# Patient Record
Sex: Female | Born: 1940 | Race: White | Hispanic: No | Smoking: Never smoker
Health system: Southern US, Community
[De-identification: ages and names within clinical notes are randomized; demographics above are authoritative.]

## PROBLEM LIST (undated history)

## (undated) DIAGNOSIS — I480 Paroxysmal atrial fibrillation: Secondary | ICD-10-CM

## (undated) DIAGNOSIS — R011 Cardiac murmur, unspecified: Secondary | ICD-10-CM

## (undated) DIAGNOSIS — C801 Malignant (primary) neoplasm, unspecified: Secondary | ICD-10-CM

## (undated) DIAGNOSIS — I8390 Asymptomatic varicose veins of unspecified lower extremity: Secondary | ICD-10-CM

## (undated) DIAGNOSIS — Q600 Renal agenesis, unilateral: Secondary | ICD-10-CM

## (undated) HISTORY — PX: ABDOMINAL HYSTERECTOMY: SHX81

---

## 2004-07-02 ENCOUNTER — Ambulatory Visit: Payer: Self-pay | Admitting: Gastroenterology

## 2005-10-20 ENCOUNTER — Ambulatory Visit: Payer: Self-pay | Admitting: Internal Medicine

## 2007-09-19 ENCOUNTER — Ambulatory Visit: Payer: Self-pay | Admitting: Family Medicine

## 2008-12-24 ENCOUNTER — Ambulatory Visit: Payer: Self-pay | Admitting: Family Medicine

## 2009-10-07 ENCOUNTER — Ambulatory Visit: Payer: Self-pay | Admitting: Ophthalmology

## 2009-10-19 ENCOUNTER — Ambulatory Visit: Payer: Self-pay | Admitting: Ophthalmology

## 2009-11-16 ENCOUNTER — Ambulatory Visit: Payer: Self-pay | Admitting: Ophthalmology

## 2010-01-28 ENCOUNTER — Ambulatory Visit: Payer: Self-pay | Admitting: Family Medicine

## 2011-03-19 ENCOUNTER — Ambulatory Visit: Payer: Self-pay | Admitting: Internal Medicine

## 2011-04-11 ENCOUNTER — Ambulatory Visit: Payer: Self-pay | Admitting: Family Medicine

## 2012-08-27 DIAGNOSIS — C44519 Basal cell carcinoma of skin of other part of trunk: Secondary | ICD-10-CM | POA: Insufficient documentation

## 2012-08-30 DIAGNOSIS — M8580 Other specified disorders of bone density and structure, unspecified site: Secondary | ICD-10-CM | POA: Insufficient documentation

## 2012-08-30 DIAGNOSIS — E785 Hyperlipidemia, unspecified: Secondary | ICD-10-CM | POA: Insufficient documentation

## 2012-09-04 ENCOUNTER — Ambulatory Visit: Payer: Self-pay | Admitting: Family Medicine

## 2012-10-30 DIAGNOSIS — R011 Cardiac murmur, unspecified: Secondary | ICD-10-CM | POA: Insufficient documentation

## 2012-11-22 ENCOUNTER — Ambulatory Visit: Payer: Self-pay | Admitting: Family Medicine

## 2013-10-17 ENCOUNTER — Ambulatory Visit: Payer: Self-pay | Admitting: Family Medicine

## 2014-03-13 DIAGNOSIS — H409 Unspecified glaucoma: Secondary | ICD-10-CM | POA: Insufficient documentation

## 2014-04-02 ENCOUNTER — Ambulatory Visit: Payer: Self-pay | Admitting: Family Medicine

## 2014-06-02 ENCOUNTER — Ambulatory Visit: Payer: Self-pay | Admitting: Unknown Physician Specialty

## 2014-07-14 ENCOUNTER — Other Ambulatory Visit: Payer: Self-pay | Admitting: Oncology

## 2014-12-19 ENCOUNTER — Other Ambulatory Visit: Payer: Self-pay

## 2014-12-19 DIAGNOSIS — Z1231 Encounter for screening mammogram for malignant neoplasm of breast: Secondary | ICD-10-CM

## 2015-01-06 ENCOUNTER — Ambulatory Visit: Payer: Self-pay

## 2015-02-25 ENCOUNTER — Ambulatory Visit
Admission: RE | Admit: 2015-02-25 | Discharge: 2015-02-25 | Disposition: A | Discharge status: Home / Self Care | Payer: Medicare Other | Source: Ambulatory Visit | Attending: Family Medicine | Admitting: Family Medicine

## 2015-02-25 DIAGNOSIS — Z1231 Encounter for screening mammogram for malignant neoplasm of breast: Secondary | ICD-10-CM | POA: Diagnosis present

## 2015-02-25 HISTORY — DX: Malignant (primary) neoplasm, unspecified: C80.1

## 2015-04-30 DIAGNOSIS — I8390 Asymptomatic varicose veins of unspecified lower extremity: Secondary | ICD-10-CM | POA: Insufficient documentation

## 2015-07-03 ENCOUNTER — Other Ambulatory Visit: Payer: Self-pay | Admitting: Family Medicine

## 2015-07-03 DIAGNOSIS — Z1231 Encounter for screening mammogram for malignant neoplasm of breast: Secondary | ICD-10-CM

## 2015-07-03 DIAGNOSIS — M810 Age-related osteoporosis without current pathological fracture: Secondary | ICD-10-CM

## 2015-07-23 ENCOUNTER — Ambulatory Visit
Admission: RE | Admit: 2015-07-23 | Discharge: 2015-07-23 | Disposition: A | Discharge status: Home / Self Care | Payer: Medicare Other | Source: Ambulatory Visit | Attending: Family Medicine | Admitting: Family Medicine

## 2015-07-23 DIAGNOSIS — M810 Age-related osteoporosis without current pathological fracture: Secondary | ICD-10-CM | POA: Insufficient documentation

## 2015-07-23 DIAGNOSIS — Z780 Asymptomatic menopausal state: Secondary | ICD-10-CM | POA: Insufficient documentation

## 2016-05-10 DIAGNOSIS — M810 Age-related osteoporosis without current pathological fracture: Secondary | ICD-10-CM | POA: Insufficient documentation

## 2016-05-23 ENCOUNTER — Other Ambulatory Visit: Payer: Self-pay | Admitting: Family Medicine

## 2016-05-23 DIAGNOSIS — Z1231 Encounter for screening mammogram for malignant neoplasm of breast: Secondary | ICD-10-CM

## 2016-05-24 ENCOUNTER — Ambulatory Visit
Admission: RE | Admit: 2016-05-24 | Discharge: 2016-05-24 | Disposition: A | Discharge status: Home / Self Care | Payer: Medicare Other | Source: Ambulatory Visit | Attending: Family Medicine | Admitting: Family Medicine

## 2016-05-24 DIAGNOSIS — Z1231 Encounter for screening mammogram for malignant neoplasm of breast: Secondary | ICD-10-CM | POA: Insufficient documentation

## 2017-07-18 ENCOUNTER — Other Ambulatory Visit: Payer: Self-pay | Admitting: Family Medicine

## 2017-07-18 DIAGNOSIS — M899 Disorder of bone, unspecified: Secondary | ICD-10-CM

## 2017-07-18 DIAGNOSIS — Z905 Acquired absence of kidney: Secondary | ICD-10-CM | POA: Insufficient documentation

## 2017-07-18 DIAGNOSIS — M949 Disorder of cartilage, unspecified: Principal | ICD-10-CM

## 2017-08-09 ENCOUNTER — Other Ambulatory Visit: Payer: Self-pay | Admitting: Family Medicine

## 2017-08-09 DIAGNOSIS — Z1231 Encounter for screening mammogram for malignant neoplasm of breast: Secondary | ICD-10-CM

## 2017-08-24 ENCOUNTER — Ambulatory Visit
Admission: RE | Admit: 2017-08-24 | Discharge: 2017-08-24 | Disposition: A | Discharge status: Home / Self Care | Payer: Medicare Other | Source: Ambulatory Visit | Attending: Family Medicine | Admitting: Family Medicine

## 2017-08-24 DIAGNOSIS — Z1231 Encounter for screening mammogram for malignant neoplasm of breast: Secondary | ICD-10-CM

## 2017-08-24 DIAGNOSIS — M899 Disorder of bone, unspecified: Secondary | ICD-10-CM

## 2017-08-24 DIAGNOSIS — M810 Age-related osteoporosis without current pathological fracture: Secondary | ICD-10-CM | POA: Insufficient documentation

## 2017-08-24 DIAGNOSIS — M949 Disorder of cartilage, unspecified: Secondary | ICD-10-CM

## 2018-10-05 ENCOUNTER — Other Ambulatory Visit: Payer: Self-pay | Admitting: Family Medicine

## 2018-10-05 DIAGNOSIS — Z1231 Encounter for screening mammogram for malignant neoplasm of breast: Secondary | ICD-10-CM

## 2018-10-05 DIAGNOSIS — Z780 Asymptomatic menopausal state: Secondary | ICD-10-CM

## 2018-11-02 ENCOUNTER — Other Ambulatory Visit: Payer: Self-pay | Admitting: Family Medicine

## 2018-11-02 DIAGNOSIS — Z780 Asymptomatic menopausal state: Secondary | ICD-10-CM

## 2018-11-02 DIAGNOSIS — Z1231 Encounter for screening mammogram for malignant neoplasm of breast: Secondary | ICD-10-CM

## 2019-02-05 ENCOUNTER — Ambulatory Visit
Admission: RE | Admit: 2019-02-05 | Discharge: 2019-02-05 | Disposition: A | Discharge status: Home / Self Care | Payer: Medicare Other | Source: Ambulatory Visit | Attending: Family Medicine | Admitting: Family Medicine

## 2019-02-05 ENCOUNTER — Encounter (INDEPENDENT_AMBULATORY_CARE_PROVIDER_SITE_OTHER): Payer: Self-pay

## 2019-02-05 ENCOUNTER — Other Ambulatory Visit: Payer: Self-pay

## 2019-02-05 DIAGNOSIS — Z780 Asymptomatic menopausal state: Secondary | ICD-10-CM | POA: Diagnosis present

## 2019-02-05 DIAGNOSIS — Z1231 Encounter for screening mammogram for malignant neoplasm of breast: Secondary | ICD-10-CM | POA: Diagnosis present

## 2019-10-18 ENCOUNTER — Other Ambulatory Visit: Payer: Self-pay | Admitting: Family Medicine

## 2019-10-18 DIAGNOSIS — Z1231 Encounter for screening mammogram for malignant neoplasm of breast: Secondary | ICD-10-CM

## 2020-03-17 ENCOUNTER — Emergency Department
Admission: EM | Admit: 2020-03-17 | Discharge: 2020-03-17 | Disposition: A | Discharge status: Home / Self Care | Payer: Medicare Other | Attending: Emergency Medicine | Admitting: Emergency Medicine

## 2020-03-17 ENCOUNTER — Emergency Department: Payer: Medicare Other

## 2020-03-17 ENCOUNTER — Other Ambulatory Visit: Payer: Self-pay

## 2020-03-17 DIAGNOSIS — Z85828 Personal history of other malignant neoplasm of skin: Secondary | ICD-10-CM | POA: Insufficient documentation

## 2020-03-17 DIAGNOSIS — I498 Other specified cardiac arrhythmias: Secondary | ICD-10-CM | POA: Diagnosis not present

## 2020-03-17 DIAGNOSIS — N3000 Acute cystitis without hematuria: Secondary | ICD-10-CM

## 2020-03-17 DIAGNOSIS — R5383 Other fatigue: Secondary | ICD-10-CM | POA: Diagnosis present

## 2020-03-17 DIAGNOSIS — R531 Weakness: Secondary | ICD-10-CM

## 2020-03-17 LAB — BASIC METABOLIC PANEL
Anion gap: 13 (ref 5–15)
BUN: 15 mg/dL (ref 8–23)
CO2: 22 mmol/L (ref 22–32)
Calcium: 8.6 mg/dL — ABNORMAL LOW (ref 8.9–10.3)
Chloride: 104 mmol/L (ref 98–111)
Creatinine, Ser: 1.06 mg/dL — ABNORMAL HIGH (ref 0.44–1.00)
GFR calc Af Amer: 57 mL/min — ABNORMAL LOW (ref >60)
GFR calc non Af Amer: 49 mL/min — ABNORMAL LOW (ref >60)
Glucose, Bld: 109 mg/dL — ABNORMAL HIGH (ref 70–99)
Potassium: 3.3 mmol/L — ABNORMAL LOW (ref 3.5–5.1)
Sodium: 139 mmol/L (ref 135–145)

## 2020-03-17 LAB — URINALYSIS, COMPLETE (UACMP) WITH MICROSCOPIC
Bilirubin Urine: NEGATIVE
Glucose, UA: NEGATIVE mg/dL
Ketones, ur: 5 mg/dL — AB
Nitrite: NEGATIVE
Protein, ur: NEGATIVE mg/dL
Specific Gravity, Urine: 1.003 — ABNORMAL LOW (ref 1.005–1.030)
pH: 6 (ref 5.0–8.0)

## 2020-03-17 LAB — CBC
HCT: 38.9 % (ref 36.0–46.0)
Hemoglobin: 13.6 g/dL (ref 12.0–15.0)
MCH: 32.8 pg (ref 26.0–34.0)
MCHC: 35 g/dL (ref 30.0–36.0)
MCV: 93.7 fL (ref 80.0–100.0)
Platelets: 300 10*3/uL (ref 150–400)
RBC: 4.15 MIL/uL (ref 3.87–5.11)
RDW: 12.6 % (ref 11.5–15.5)
WBC: 8.5 10*3/uL (ref 4.0–10.5)
nRBC: 0 % (ref 0.0–0.2)

## 2020-03-17 LAB — TROPONIN I (HIGH SENSITIVITY): Troponin I (High Sensitivity): 6 ng/L (ref <18)

## 2020-03-17 LAB — TSH: TSH: 1.627 u[IU]/mL (ref 0.350–4.500)

## 2020-03-17 LAB — MAGNESIUM: Magnesium: 2 mg/dL (ref 1.7–2.4)

## 2020-03-17 MED ORDER — POTASSIUM CHLORIDE CRYS ER 20 MEQ PO TBCR
20.0000 meq | EXTENDED_RELEASE_TABLET | Freq: Once | ORAL | Status: AC
  Administered 2020-03-17: 20 meq via ORAL
  Filled 2020-03-17: qty 1

## 2020-03-17 MED ORDER — SODIUM CHLORIDE 0.9% FLUSH: 3.0000 mL | Freq: Once | INTRAVENOUS | Status: DC

## 2020-03-17 MED ORDER — LACTATED RINGERS IV BOLUS
1000.0000 mL | Freq: Once | INTRAVENOUS | Status: AC
  Administered 2020-03-17: 1000 mL via INTRAVENOUS

## 2020-03-17 MED ORDER — CEPHALEXIN 500 MG PO CAPS: 500.0000 mg | ORAL_CAPSULE | Freq: Two times a day (BID) | ORAL | 0 refills | Status: AC

## 2020-03-17 NOTE — ED Provider Notes (Signed)
[facility] Emergency Department Provider Note   ____________________________________________   First MD Initiated Contact with Patient 03/17/20 1214     (approximate)  I have reviewed the triage vital signs and the nursing notes.   HISTORY  Chief Complaint Irregular Heart Beat    HPI Charlene Anderson is a 79 y.o. female with past medical history of skin cancer who presents to the ED complaining of fatigue.  Patient reports that she has been increasingly weak and fatigued over about the past week, scheduled follow-up with her PCP for this today, but while there was told that she has an abnormal heart rhythm.  She was referred to the ED due to concern for atrial fibrillation with RVR.  Patient states that she has been seen by a cardiologist before for irregular heartbeat, but denies any history of atrial fibrillation.  She states she has not had any chest pain, shortness of breath, or palpitations.  She also denies any recent fevers, cough, vomiting, diarrhea, abdominal pain, or dysuria.        Past Medical History:  Diagnosis Date  . Cancer ([place])    skin ca    There are no problems to display for this patient.   Past Surgical History:  Procedure Laterality Date  . ABDOMINAL HYSTERECTOMY      Prior to Admission medications   Medication Sig Start Date End Date Taking? Authorizing Provider  cephALEXin (KEFLEX) 500 MG capsule Take 1 capsule (500 mg total) by mouth 2 (two) times daily for 7 days. 03/17/20 03/24/20  Blake Divine, MD    Allergies Sulfa antibiotics  Family History  Problem Relation Age of Onset  . Breast cancer Mother 3  . Breast cancer Maternal Aunt 44  . Breast cancer Cousin 74       mat cousin    Social History Social History   Tobacco Use  . Smoking status: Not on file  Substance Use Topics  . Alcohol use: Not on file  . Drug use: Not on file    Review of Systems  Constitutional: No fever/chills.  Positive  for generalized weakness and fatigue. Eyes: No visual changes. ENT: No sore throat. Cardiovascular: Denies chest pain. Respiratory: Denies shortness of breath. Gastrointestinal: No abdominal pain.  No nausea, no vomiting.  No diarrhea.  No constipation. Genitourinary: Negative for dysuria. Musculoskeletal: Negative for back pain. Skin: Negative for rash. Neurological: Negative for headaches, focal weakness or numbness.  ____________________________________________   PHYSICAL EXAM:  VITAL SIGNS: ED Triage Vitals  Enc Vitals Group     BP      Pulse      Resp      Temp      Temp src      SpO2      Weight      Height      Head Circumference      Peak Flow      Pain Score      Pain Loc      Pain Edu?      Excl. in [place]?     Constitutional: Alert and oriented. Eyes: Conjunctivae are normal. Head: Atraumatic. Nose: No congestion/rhinnorhea. Mouth/Throat: Mucous membranes are moist. Neck: Normal ROM Cardiovascular: Tachycardic, irregularly irregular rhythm. Grossly normal heart sounds. Respiratory: Normal respiratory effort.  No retractions. Lungs CTAB. Gastrointestinal: Soft and nontender. No distention. Genitourinary: deferred Musculoskeletal: No lower extremity tenderness nor edema. Neurologic:  Normal speech and language. No gross focal neurologic deficits are appreciated. Skin:  Skin is warm, dry and intact. No rash noted. Psychiatric: Mood and affect are normal. Speech and behavior are normal.  ____________________________________________   LABS (all labs ordered are listed, but only abnormal results are displayed)  Labs Reviewed  BASIC METABOLIC PANEL - Abnormal; Notable for the following components:      Result Value   Potassium 3.3 (*)    Glucose, Bld 109 (*)    Creatinine, Ser 1.06 (*)    Calcium 8.6 (*)    GFR calc non Af Amer 49 (*)    GFR calc Af Amer 57 (*)    All other components within normal limits  URINALYSIS, COMPLETE (UACMP) WITH MICROSCOPIC  - Abnormal; Notable for the following components:   Color, Urine YELLOW (*)    APPearance HAZY (*)    Specific Gravity, Urine 1.003 (*)    Hgb urine dipstick SMALL (*)    Ketones, ur 5 (*)    Leukocytes,Ua SMALL (*)    Bacteria, UA FEW (*)    All other components within normal limits  URINE CULTURE  CBC  MAGNESIUM  TSH  TROPONIN I (HIGH SENSITIVITY)   ____________________________________________  EKG  ED ECG REPORT I, Blake Divine, the attending physician, personally viewed and interpreted this ECG.   Date: 03/17/2020  EKG Time: 12:20  Rate: 124  Rhythm: atrial fibrillation, rate 124  Axis: Normal  Intervals:none  ST&T Change: None  ED ECG REPORT I, Blake Divine, the attending physician, personally viewed and interpreted this ECG.   Date: 03/17/2020  EKG Time: 12:55  Rate: 87  Rhythm: normal sinus rhythm, PAC's noted  Axis: Normal  Intervals:none  ST&T Change: None    PROCEDURES  Procedure(s) performed (including Critical Care):  Procedures   ____________________________________________   INITIAL IMPRESSION / ASSESSMENT AND PLAN / ED COURSE       79 year old female with past medical history of skin cancer presents to the ED complaining of generalized weakness for the past week, referred to the ED due to concern for new onset arrhythmia.  Initial EKG showed irregular tachycardia and there was concern for atrial fibrillation, patient however has clearly delineated P waves on cardiac monitor.  Repeat EKG shows sinus rhythm with apparent PACs.  Case discussed with Dr. Ubaldo Glassing of cardiology, who agrees that EKG does not represent atrial fibrillation and is more likely PACs which patient has had previously.  Lab work is remarkable for mild AKI and patient was hydrated with IV fluids.  She also has apparent UTI that could be contributing to her fatigue but work-up is not consistent with sepsis.  We will start patient on Keflex for UTI, she is requesting to be  discharged home.  She was counseled to follow-up with her PCP as well as cardiology and to return to the ED for new or worsening symptoms.  Patient agrees with plan.      ____________________________________________   FINAL CLINICAL IMPRESSION(S) / ED DIAGNOSES  Final diagnoses:  Generalized weakness  Acute cystitis without hematuria  Sinus arrhythmia     ED Discharge Orders         Ordered    cephALEXin (KEFLEX) 500 MG capsule  2 times daily     Discontinue  Reprint     03/17/20 1510           Note:  This document was prepared using Dragon voice recognition software and may include unintentional dictation errors.   Blake Divine, MD 03/17/20 [phone]

## 2020-03-17 NOTE — Consult Note (Signed)
Cardiology Consultation Note    Patient ID: Charlene Anderson, MRN: [id number], DOB/AGE: 1941-07-13 79 y.o. Admit date: 03/17/2020   Date of Consult: 03/17/2020 Primary Physician: Sharyne Peach, MD Primary Cardiologist: Dr. Ubaldo Glassing  Chief Complaint: weakness Reason for Consultation: sinus affhythmia Requesting MD: Dr. Charna Archer  HPI: Charlene Anderson is a 79 y.o. female with history of palpitations in the past.  Over the past several days to week she has felt weak and fatigued.  She had no fever.  She presented to her primary care physician's office today with these complaints.  They did a Covid 19 test which per her report was negative.  She was sent to the emergency room.  Electrocardiogram in the emergency room revealed sinus arrhythmia with frequent PACs.  There was no atrial fibrillation.  Laboratory showed mild hypokalemia with potassium of 3.3.  She had mild volume depletion.  There were no ischemic changes.  Laboratories also revealed a normal troponin.  Magnesium was 2.0.  TSH is 1.6.  Chest x-ray revealed no pulmonary edema or other abnormality.  She is currently pain-free and feels somewhat better after IV fluids.  She is anxious to go home.  Past Medical History:  Diagnosis Date  . Cancer ([place])    skin ca      Surgical History:  Past Surgical History:  Procedure Laterality Date  . ABDOMINAL HYSTERECTOMY       Home Meds: Prior to Admission medications   Not on File    Inpatient Medications:  . potassium chloride  20 mEq Oral Once  . sodium chloride flush  3 mL Intravenous Once     Allergies:  Allergies  Allergen Reactions  . Sulfa Antibiotics Diarrhea    Social History   Socioeconomic History  . Marital status: Widowed    Spouse name: Not on file  . Number of children: Not on file  . Years of education: Not on file  . Highest education level: Not on file  Occupational History  . Not on file  Tobacco Use  . Smoking status: Not on file  Substance and  Sexual Activity  . Alcohol use: Not on file  . Drug use: Not on file  . Sexual activity: Not on file  Other Topics Concern  . Not on file  Social History Narrative  . Not on file   Social Determinants of Health   Financial Resource Strain:   . Difficulty of Paying Living Expenses:   Food Insecurity:   . Worried About Charity fundraiser in the Last Year:   . Arboriculturist in the Last Year:   Transportation Needs:   . Film/video editor (Medical):   Marland Kitchen Lack of Transportation (Non-Medical):   Physical Activity:   . Days of Exercise per Week:   . Minutes of Exercise per Session:   Stress:   . Feeling of Stress :   Social Connections:   . Frequency of Communication with Friends and Family:   . Frequency of Social Gatherings with Friends and Family:   . Attends Religious Services:   . Active Member of Clubs or Organizations:   . Attends Archivist Meetings:   Marland Kitchen Marital Status:   Intimate Partner Violence:   . Fear of Current or Ex-Partner:   . Emotionally Abused:   Marland Kitchen Physically Abused:   . Sexually Abused:      Family History  Problem Relation Age of Onset  . Breast cancer Mother 34  .  Breast cancer Maternal Aunt 37  . Breast cancer Cousin 54       mat cousin     Review of Systems: A 12-system review of systems was performed and is negative except as noted in the HPI.  Labs: No results for input(s): CKTOTAL, CKMB, TROPONINI in the last 72 hours. Lab Results  Component Value Date   WBC 8.5 03/17/2020   HGB 13.6 03/17/2020   HCT 38.9 03/17/2020   MCV 93.7 03/17/2020   PLT 300 03/17/2020    Recent Labs  Lab 03/17/20 1231  NA 139  K 3.3*  CL 104  CO2 22  BUN 15  CREATININE 1.06*  CALCIUM 8.6*  GLUCOSE 109*   No results found for: CHOL, HDL, LDLCALC, TRIG No results found for: DDIMER  Radiology/Studies:  DG Chest 2 View  Result Date: 03/17/2020 CLINICAL DATA:  Atrial fibrillation. EXAM: CHEST - 2 VIEW COMPARISON:  No prior. FINDINGS:  Mediastinum hilar structures normal. Bilateral pleural-parenchymal thickening noted most consistent with scarring. No focal alveolar infiltrate. No pleural effusion or pneumothorax. Midthoracic mild vertebral body mild compression fracture. Age undetermined IMPRESSION: Number bilateral pleural-parenchymal thickening consistent with scarring. 2. Midthoracic mild vertebral body compression fracture, age undetermined. Electronically Signed   By: Marcello Moores  Register   On: 03/17/2020 12:51    Wt Readings from Last 3 Encounters:  03/17/20 54.4 kg    EKG: Sinus arrhythmia with PACs.  No ischemia.  Physical Exam:  Blood pressure 126/74, pulse (!) 115, temperature 97.7 F (36.5 C), temperature source Oral, resp. rate (!) 25, height 5\' 3"  (1.6 m), weight 54.4 kg, SpO2 100 %. Body mass index is 21.26 kg/m. General: Well developed, well nourished, in no acute distress. Head: Normocephalic, atraumatic, sclera non-icteric, no xanthomas, nares are without discharge.  Neck: Negative for carotid bruits. JVD not elevated. Lungs: Clear bilaterally to auscultation without wheezes, rales, or rhonchi. Breathing is unlabored. Heart: RRR with S1 S2. No murmurs, rubs, or gallops appreciated. Abdomen: Soft, non-tender, non-distended with normoactive bowel sounds. No hepatomegaly. No rebound/guarding. No obvious abdominal masses. Msk:  Strength and tone appear normal for age. Extremities: No clubbing or cyanosis. No edema.  Distal pedal pulses are 2+ and equal bilaterally. Neuro: Alert and oriented X 3. No facial asymmetry. No focal deficit. Moves all extremities spontaneously. Psych:  Responds to questions appropriately with a normal affect.     Assessment and Plan  79 year old female who was sent to the emergency room with noted irregular heartbeat.  Work-up in the ER was unremarkable.  Echocardiogram revealed sinus arrhythmia.  There were no ischemic changes.  Troponin was normal.  Potassium was 3.3.  Magnesium is  2.  Chest x-ray was unremarkable.  No obvious cardiac pathology.  No evidence of pulmonary edema.  No evidence of ischemia clinically or by electrocardiogram.  No atrial fibrillation.  She is hemodynamically stable and feels somewhat better after IV fluids.  She is concerned she has an infection somewhere.  She is somewhat tachycardic but is also somewhat anxious.  I think at this point it is okay for her to go home.  We will see her back in the office in 2 to 3 days.  Contact me should she have any further problems.  No further inpatient cardiac work-up appears indicated at present.  Signed, Teodoro Spray MD 03/17/2020, 1:38 PM Pager: [phone]

## 2020-03-17 NOTE — ED Triage Notes (Signed)
Pt here with weakness that started on Wed and went to see her primary and noticed that her heart rate was irregular. Pt has no c/o of pain, vitals WNL.

## 2020-03-19 LAB — URINE CULTURE: Culture: >=100000 — AB

## 2020-07-23 IMAGING — MG DIGITAL SCREENING BILATERAL MAMMOGRAM WITH TOMO AND CAD
8 series · 8 of 24 positions shown · non-contrast
Comparison: Previous exam(s).

CLINICAL DATA: Screening.

EXAM:
DIGITAL SCREENING BILATERAL MAMMOGRAM WITH TOMO AND CAD

[R CC synth-2D]
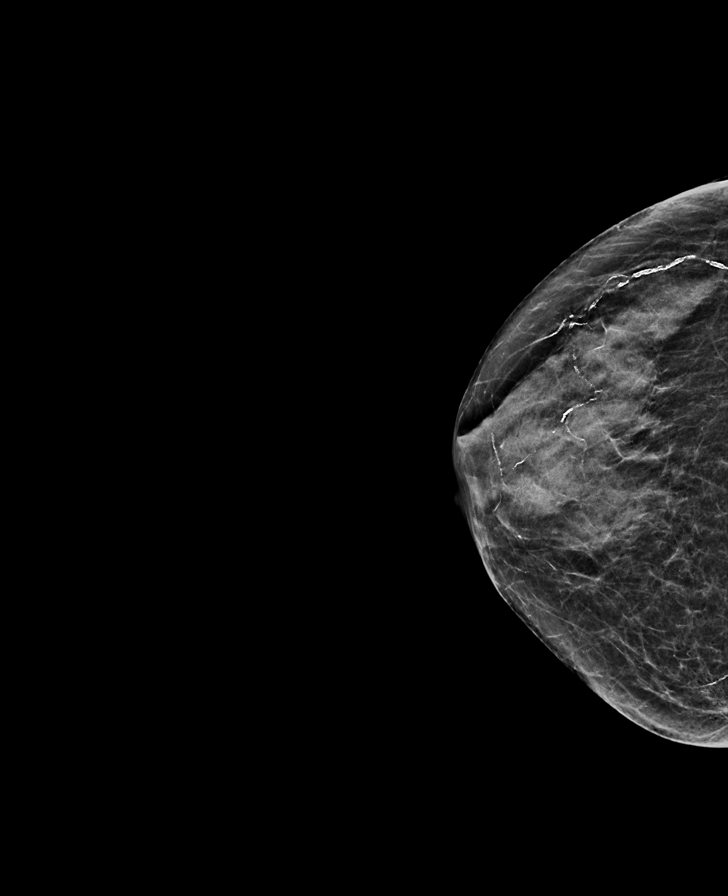

[L CC synth-2D]
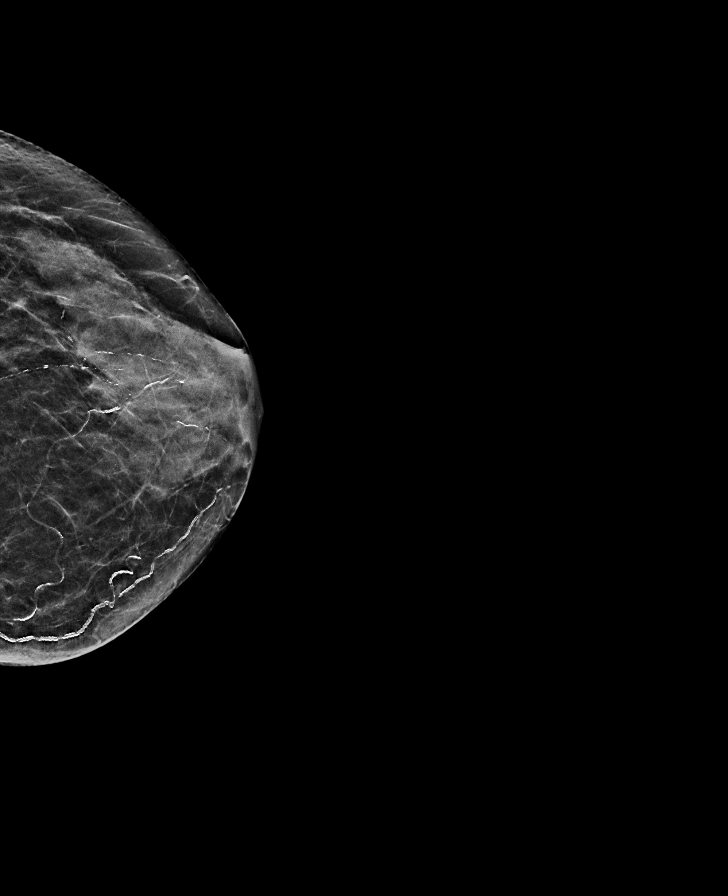

[R MLO synth-2D]
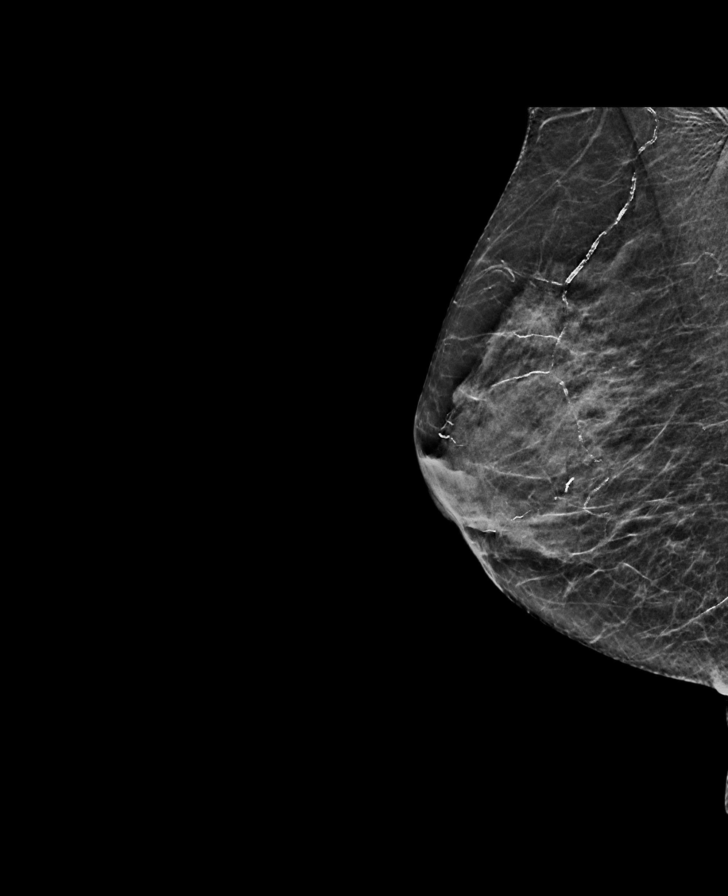

[L MLO synth-2D]
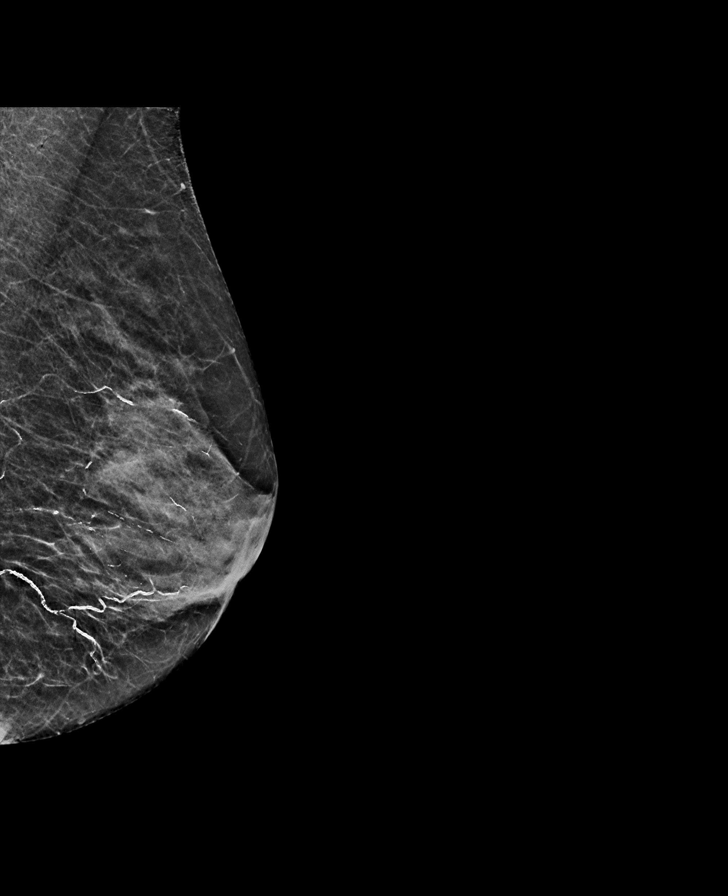

[L CC tomo · tomo slice 23/46.0]
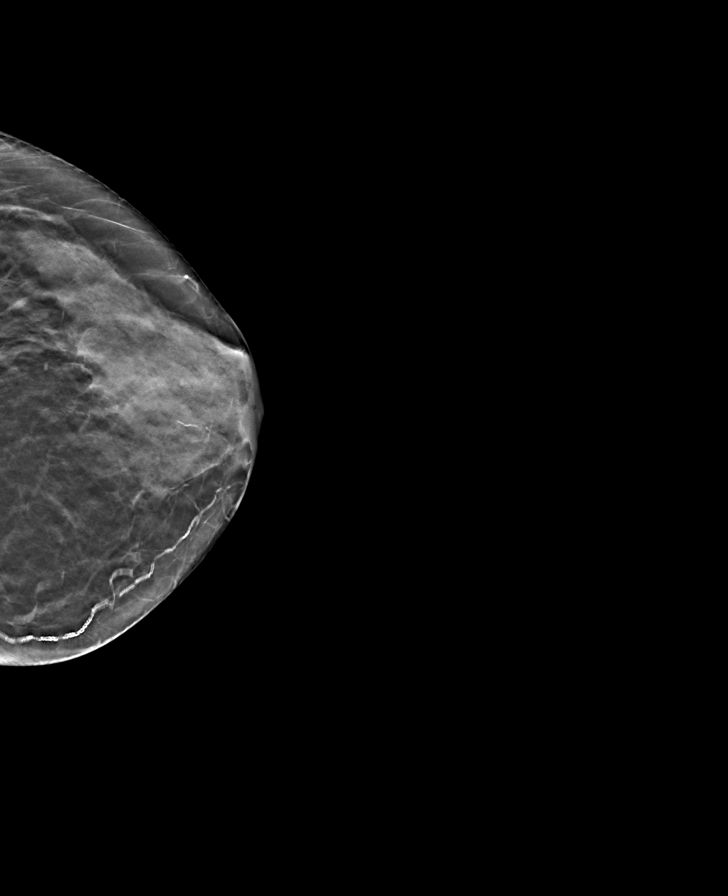

[R CC tomo · tomo slice 24/47.0]
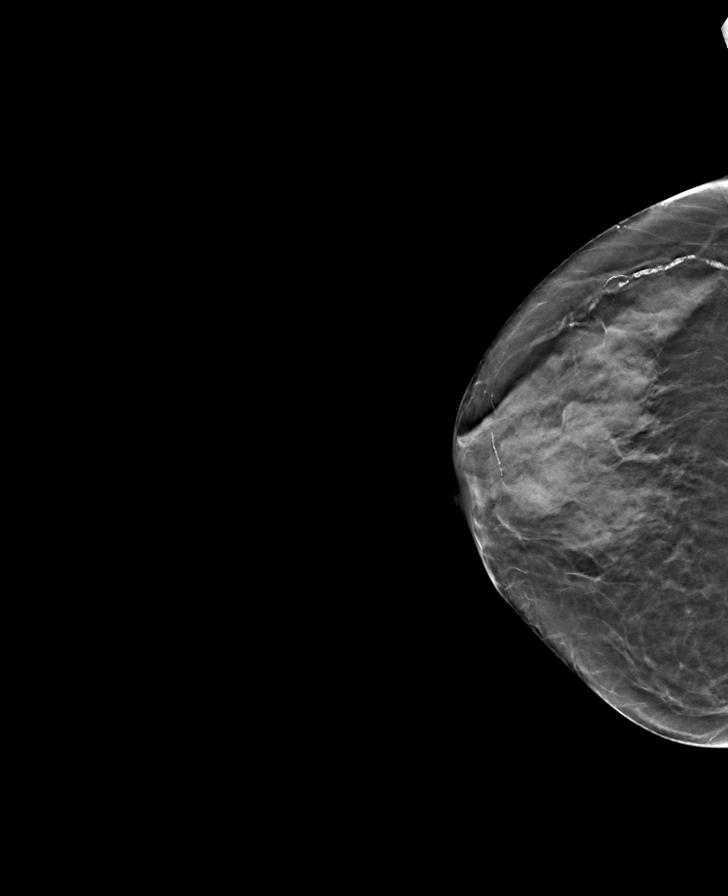

[R MLO tomo · tomo slice 23/44.0]
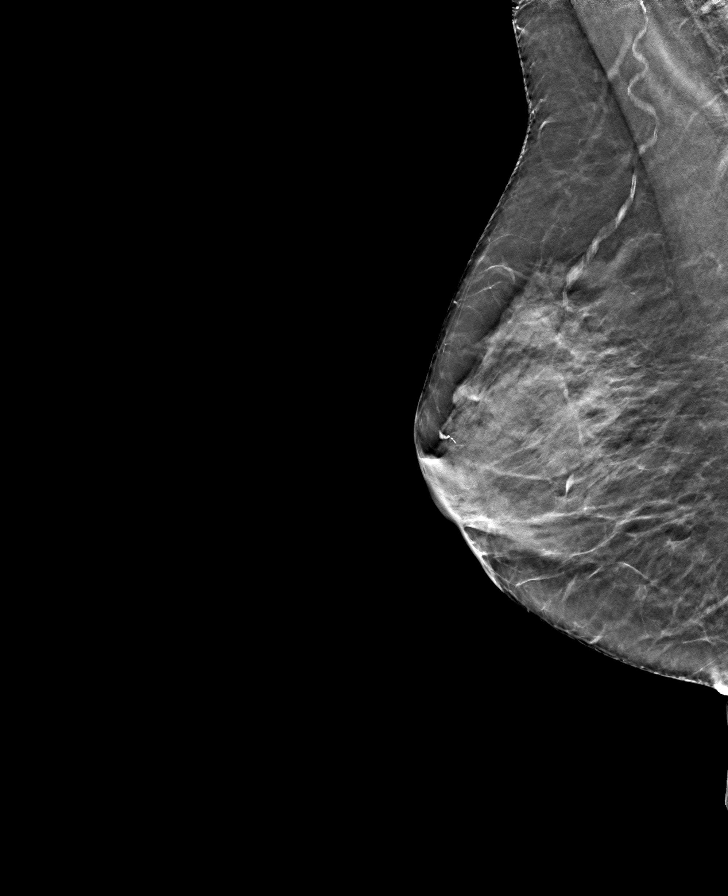

[L MLO tomo · tomo slice 22/43.0]
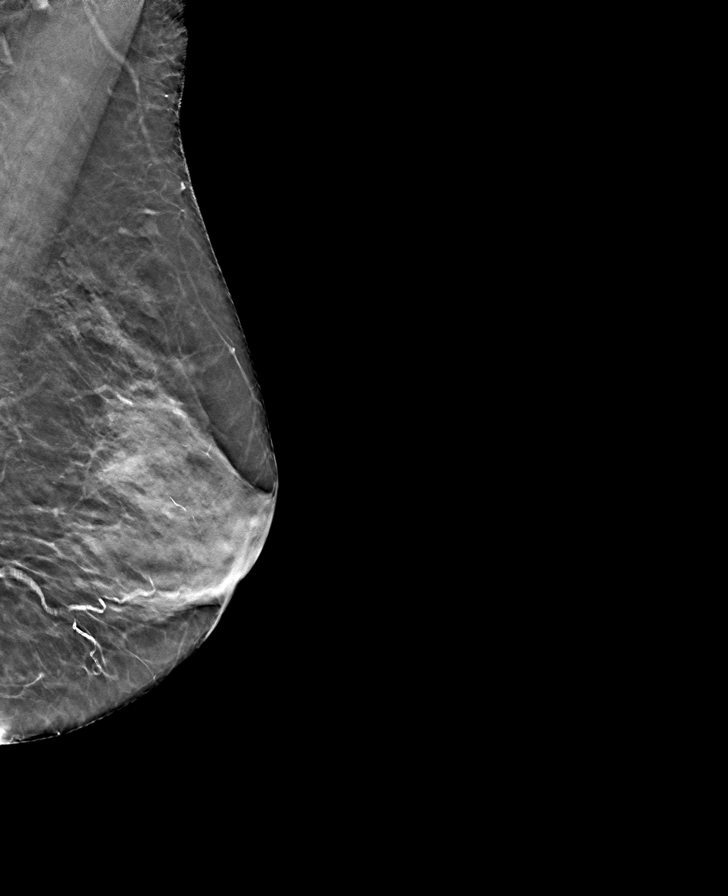

[8 of 24 positions shown; findings below may reference images not displayed]

ACR Breast Density Category d: The breast tissue is extremely dense,
which lowers the sensitivity of mammography
FINDINGS: There are no findings suspicious for malignancy. Images were
processed with CAD.
IMPRESSION: No mammographic evidence of malignancy. A result letter of this
screening mammogram will be mailed directly to the patient.

RECOMMENDATION:
Screening mammogram in one year. (Code:WO-0-ZI0)

BI-RADS CATEGORY  1: Negative.

## 2020-10-08 DIAGNOSIS — I480 Paroxysmal atrial fibrillation: Secondary | ICD-10-CM | POA: Insufficient documentation

## 2020-10-08 DIAGNOSIS — L409 Psoriasis, unspecified: Secondary | ICD-10-CM | POA: Insufficient documentation

## 2020-11-24 ENCOUNTER — Other Ambulatory Visit: Payer: Self-pay | Admitting: Family Medicine

## 2020-11-24 DIAGNOSIS — Z1231 Encounter for screening mammogram for malignant neoplasm of breast: Secondary | ICD-10-CM

## 2020-12-01 ENCOUNTER — Ambulatory Visit
Admission: RE | Admit: 2020-12-01 | Discharge: 2020-12-01 | Disposition: A | Discharge status: Home / Self Care | Payer: Medicare Other | Source: Ambulatory Visit | Attending: Family Medicine | Admitting: Family Medicine

## 2020-12-01 ENCOUNTER — Other Ambulatory Visit: Payer: Self-pay

## 2020-12-01 DIAGNOSIS — Z1231 Encounter for screening mammogram for malignant neoplasm of breast: Secondary | ICD-10-CM | POA: Insufficient documentation

## 2020-12-08 ENCOUNTER — Other Ambulatory Visit: Payer: Self-pay | Admitting: Family Medicine

## 2020-12-08 DIAGNOSIS — Z780 Asymptomatic menopausal state: Secondary | ICD-10-CM

## 2022-04-27 ENCOUNTER — Other Ambulatory Visit: Payer: Self-pay | Admitting: Family Medicine

## 2022-04-27 DIAGNOSIS — Z1231 Encounter for screening mammogram for malignant neoplasm of breast: Secondary | ICD-10-CM

## 2022-05-17 ENCOUNTER — Ambulatory Visit
Admission: RE | Admit: 2022-05-17 | Discharge: 2022-05-17 | Disposition: A | Discharge status: Home / Self Care | Payer: Medicare Other | Source: Ambulatory Visit | Attending: Family Medicine | Admitting: Family Medicine

## 2022-05-17 DIAGNOSIS — Z1231 Encounter for screening mammogram for malignant neoplasm of breast: Secondary | ICD-10-CM | POA: Diagnosis not present

## 2022-11-04 ENCOUNTER — Other Ambulatory Visit: Payer: Self-pay | Admitting: Family Medicine

## 2022-11-04 DIAGNOSIS — Z780 Asymptomatic menopausal state: Secondary | ICD-10-CM

## 2022-12-01 ENCOUNTER — Ambulatory Visit
Admission: RE | Admit: 2022-12-01 | Discharge: 2022-12-01 | Disposition: A | Discharge status: Home / Self Care | Payer: Medicare Other | Source: Ambulatory Visit | Attending: Family Medicine | Admitting: Family Medicine

## 2022-12-01 DIAGNOSIS — Z780 Asymptomatic menopausal state: Secondary | ICD-10-CM | POA: Insufficient documentation

## 2023-01-14 ENCOUNTER — Ambulatory Visit (INDEPENDENT_AMBULATORY_CARE_PROVIDER_SITE_OTHER)
Admission: EM | Admit: 2023-01-14 | Discharge: 2023-01-14 | Disposition: A | Discharge status: Home / Self Care | Payer: Medicare Other | Source: Home / Self Care | Attending: Family Medicine | Admitting: Family Medicine

## 2023-01-14 DIAGNOSIS — N3001 Acute cystitis with hematuria: Secondary | ICD-10-CM | POA: Insufficient documentation

## 2023-01-14 DIAGNOSIS — A419 Sepsis, unspecified organism: Secondary | ICD-10-CM | POA: Diagnosis not present

## 2023-01-14 DIAGNOSIS — I4891 Unspecified atrial fibrillation: Secondary | ICD-10-CM | POA: Diagnosis not present

## 2023-01-14 LAB — URINALYSIS, W/ REFLEX TO CULTURE (INFECTION SUSPECTED)
Bilirubin Urine: NEGATIVE
Glucose, UA: NEGATIVE mg/dL
Ketones, ur: 15 mg/dL — AB
Nitrite: POSITIVE — AB
Protein, ur: 100 mg/dL — AB
Specific Gravity, Urine: 1.02 (ref 1.005–1.030)
pH: 6 (ref 5.0–8.0)

## 2023-01-14 LAB — URINE CULTURE

## 2023-01-14 MED ORDER — CEFDINIR 300 MG PO CAPS: 300.0000 mg | ORAL_CAPSULE | Freq: Two times a day (BID) | ORAL | 0 refills | Status: DC

## 2023-01-14 NOTE — ED Provider Notes (Signed)
[facility]    CSN: [id number] Arrival date & time: 01/14/23  0830      History   Chief Complaint No chief complaint on file.    HPI HPI Charlene Anderson is a 82 y.o. female.    Charlene Anderson presents for difficulty urinating. Believes she has another kidney infection. She only has 1 kidney.  Tried nothing prior to arrival.  Has  not had any antibiotics in last 30 days.   When she urinates she has an odor. Denies back pain, hematuria, fever, chills or abdominal pain.        Past Medical History:  Diagnosis Date   Cancer ([facility])    skin ca    There are no problems to display for this patient.   Past Surgical History:  Procedure Laterality Date   ABDOMINAL HYSTERECTOMY      OB History   No obstetric history on file.      Home Medications    Prior to Admission medications   Medication Sig Start Date End Date Taking? Authorizing Provider  cefdinir (OMNICEF) 300 MG capsule Take 1 capsule (300 mg total) by mouth 2 (two) times daily. 01/14/23  Yes Katha Cabal, DO    Family History Family History  Problem Relation Age of Onset   Breast cancer Mother 49   Breast cancer Maternal Aunt 46   Breast cancer Cousin 50       mat cousin    Social History     Allergies   Sulfa antibiotics   Review of Systems Review of Systems: :negative unless otherwise stated in HPI.      Physical Exam Triage Vital Signs ED Triage Vitals  Enc Vitals Group     BP 01/14/23 0843 135/81     Pulse Rate 01/14/23 0843 (!) 128     Resp 01/14/23 0843 16     Temp 01/14/23 0843 98.2 F (36.8 C)     Temp Source 01/14/23 0843 Oral     SpO2 01/14/23 0843 98 %     Weight 01/14/23 0842 115 lb (52.2 kg)     Height 01/14/23 0842 5\' 2"  (1.575 m)     Head Circumference --      Peak Flow --      Pain Score 01/14/23 0842 3     Pain Loc --      Pain Edu? --      Excl. in [place]? --    No data found.  Updated Vital Signs BP 135/81 (BP Location: Left Arm)    Pulse (!) 128   Temp 98.2 F (36.8 C) (Oral)   Resp 16   Ht 5\' 2"  (1.575 m)   Wt 52.2 kg   SpO2 98%   BMI 21.03 kg/m   Visual Acuity Right Eye Distance:   Left Eye Distance:   Bilateral Distance:    Right Eye Near:   Left Eye Near:    Bilateral Near:     Physical Exam GEN: well appearing female in no acute distress  CVS: well perfused  RESP: speaking in full sentences without pause  ABD: soft, non-tender, non-distended, no palpable masses   SKIN: warm, dry    UC Treatments / Results  Labs (all labs ordered are listed, but only abnormal results are displayed) Labs Reviewed  URINALYSIS, W/ REFLEX TO CULTURE (INFECTION SUSPECTED) - Abnormal; Notable for the following components:      Result Value   Hgb urine dipstick LARGE (*)  Ketones, ur 15 (*)    Protein, ur 100 (*)    Nitrite POSITIVE (*)    Leukocytes,Ua SMALL (*)    Bacteria, UA MANY (*)    All other components within normal limits  URINE CULTURE    EKG   Radiology No results found.  Procedures Procedures (including critical care time)  Medications Ordered in UC Medications - No data to display  Initial Impression / Assessment and Plan / UC Course  I have reviewed the triage vital signs and the nursing notes.  Pertinent labs & imaging results that were available during my care of the patient were reviewed by me and considered in my medical decision making (see chart for details).      Patient is a 82 y.o.Marland Kitchen female  who presents for difficulty urinating.  Overall patient is well-appearing and afebrile.  Vital signs stable.  UA consistent with acute cystitis.   Hematuria supported on microscopy.  Treat with Cefdinir 2 times daily for 5 days. Urine culture obtained.  Follow-up sensitivities and change antibiotics, if needed  Return precautions including abdominal pain, fever, chills, nausea, or vomiting given. Discussed MDM, treatment plan and plan for follow-up with patient who agrees with plan.      Final Clinical Impressions(s) / UC Diagnoses   Final diagnoses:  Acute cystitis with hematuria     Discharge Instructions      You have an urinary infection infection.  Stop by the pharmacy to pick up your prescriptions.  Follow up with your primary care provider as needed.      ED Prescriptions     Medication Sig Dispense Auth. Provider   cefdinir (OMNICEF) 300 MG capsule Take 1 capsule (300 mg total) by mouth 2 (two) times daily. 10 capsule Katha Cabal, DO      PDMP not reviewed this encounter.   Katha Cabal, DO 01/14/23 [phone]

## 2023-01-14 NOTE — Discharge Instructions (Signed)
You have an urinary infection infection.  Stop by the pharmacy to pick up your prescriptions.  Follow up with your primary care provider as needed.

## 2023-01-14 NOTE — ED Triage Notes (Addendum)
Patient presents with what she described as a kidney infection, shared "I  can barely urinate, lethargic, decreased appetite, have right lower pain where kidney is located." Pt states sx started Thursday morning.

## 2023-01-15 LAB — URINE CULTURE

## 2023-01-16 LAB — URINE CULTURE

## 2023-01-17 ENCOUNTER — Other Ambulatory Visit: Payer: Self-pay

## 2023-01-17 ENCOUNTER — Emergency Department: Payer: Medicare Other

## 2023-01-17 ENCOUNTER — Encounter: Payer: Self-pay | Admitting: Family Medicine

## 2023-01-17 ENCOUNTER — Encounter: Payer: Self-pay | Admitting: *Deleted

## 2023-01-17 ENCOUNTER — Ambulatory Visit (INDEPENDENT_AMBULATORY_CARE_PROVIDER_SITE_OTHER): Payer: Medicare Other

## 2023-01-17 ENCOUNTER — Ambulatory Visit
Admission: EM | Admit: 2023-01-17 | Discharge: 2023-01-17 | Disposition: A | Discharge status: Home / Self Care | Payer: Medicare Other | Source: Home / Self Care

## 2023-01-17 ENCOUNTER — Inpatient Hospital Stay
Admission: EM | Admit: 2023-01-17 | Discharge: 2023-01-21 | DRG: 871 | Disposition: A | Discharge status: Home / Self Care | Payer: Medicare Other | Attending: Internal Medicine | Admitting: Internal Medicine

## 2023-01-17 DIAGNOSIS — Q600 Renal agenesis, unilateral: Secondary | ICD-10-CM

## 2023-01-17 DIAGNOSIS — E785 Hyperlipidemia, unspecified: Secondary | ICD-10-CM | POA: Diagnosis present

## 2023-01-17 DIAGNOSIS — Z1152 Encounter for screening for COVID-19: Secondary | ICD-10-CM

## 2023-01-17 DIAGNOSIS — Z803 Family history of malignant neoplasm of breast: Secondary | ICD-10-CM

## 2023-01-17 DIAGNOSIS — Z882 Allergy status to sulfonamides status: Secondary | ICD-10-CM

## 2023-01-17 DIAGNOSIS — I480 Paroxysmal atrial fibrillation: Secondary | ICD-10-CM | POA: Diagnosis present

## 2023-01-17 DIAGNOSIS — R051 Acute cough: Secondary | ICD-10-CM

## 2023-01-17 DIAGNOSIS — I8390 Asymptomatic varicose veins of unspecified lower extremity: Secondary | ICD-10-CM | POA: Diagnosis present

## 2023-01-17 DIAGNOSIS — I4891 Unspecified atrial fibrillation: Secondary | ICD-10-CM | POA: Insufficient documentation

## 2023-01-17 DIAGNOSIS — R0601 Orthopnea: Secondary | ICD-10-CM

## 2023-01-17 DIAGNOSIS — Z79899 Other long term (current) drug therapy: Secondary | ICD-10-CM | POA: Diagnosis not present

## 2023-01-17 DIAGNOSIS — B9620 Unspecified Escherichia coli [E. coli] as the cause of diseases classified elsewhere: Secondary | ICD-10-CM | POA: Diagnosis present

## 2023-01-17 DIAGNOSIS — E872 Acidosis, unspecified: Secondary | ICD-10-CM | POA: Diagnosis present

## 2023-01-17 DIAGNOSIS — Z7901 Long term (current) use of anticoagulants: Secondary | ICD-10-CM

## 2023-01-17 DIAGNOSIS — N390 Urinary tract infection, site not specified: Secondary | ICD-10-CM | POA: Diagnosis present

## 2023-01-17 DIAGNOSIS — R768 Other specified abnormal immunological findings in serum: Secondary | ICD-10-CM | POA: Insufficient documentation

## 2023-01-17 DIAGNOSIS — Z85828 Personal history of other malignant neoplasm of skin: Secondary | ICD-10-CM | POA: Diagnosis not present

## 2023-01-17 DIAGNOSIS — Z90710 Acquired absence of both cervix and uterus: Secondary | ICD-10-CM

## 2023-01-17 DIAGNOSIS — E876 Hypokalemia: Secondary | ICD-10-CM | POA: Diagnosis present

## 2023-01-17 DIAGNOSIS — R000 Tachycardia, unspecified: Secondary | ICD-10-CM | POA: Insufficient documentation

## 2023-01-17 DIAGNOSIS — J9600 Acute respiratory failure, unspecified whether with hypoxia or hypercapnia: Secondary | ICD-10-CM | POA: Diagnosis not present

## 2023-01-17 DIAGNOSIS — Z66 Do not resuscitate: Secondary | ICD-10-CM | POA: Diagnosis present

## 2023-01-17 DIAGNOSIS — L409 Psoriasis, unspecified: Secondary | ICD-10-CM | POA: Diagnosis present

## 2023-01-17 DIAGNOSIS — A419 Sepsis, unspecified organism: Principal | ICD-10-CM | POA: Diagnosis present

## 2023-01-17 DIAGNOSIS — J189 Pneumonia, unspecified organism: Secondary | ICD-10-CM

## 2023-01-17 HISTORY — DX: Renal agenesis, unilateral: Q60.0

## 2023-01-17 HISTORY — DX: Cardiac murmur, unspecified: R01.1

## 2023-01-17 HISTORY — DX: Asymptomatic varicose veins of unspecified lower extremity: I83.90

## 2023-01-17 HISTORY — DX: Paroxysmal atrial fibrillation: I48.0

## 2023-01-17 LAB — CBC WITH DIFFERENTIAL/PLATELET
Abs Immature Granulocytes: 0.08 10*3/uL — ABNORMAL HIGH (ref 0.00–0.07)
Basophils Absolute: 0.1 10*3/uL (ref 0.0–0.1)
Basophils Relative: 1 %
Eosinophils Absolute: 0 10*3/uL (ref 0.0–0.5)
Eosinophils Relative: 0 %
HCT: 39.9 % (ref 36.0–46.0)
Hemoglobin: 13 g/dL (ref 12.0–15.0)
Immature Granulocytes: 1 %
Lymphocytes Relative: 8 %
Lymphs Abs: 1 10*3/uL (ref 0.7–4.0)
MCH: 31.4 pg (ref 26.0–34.0)
MCHC: 32.6 g/dL (ref 30.0–36.0)
MCV: 96.4 fL (ref 80.0–100.0)
Monocytes Absolute: 1.7 10*3/uL — ABNORMAL HIGH (ref 0.1–1.0)
Monocytes Relative: 14 %
Neutro Abs: 9.6 10*3/uL — ABNORMAL HIGH (ref 1.7–7.7)
Neutrophils Relative %: 76 %
Platelets: 329 10*3/uL (ref 150–400)
RBC: 4.14 MIL/uL (ref 3.87–5.11)
RDW: 12.8 % (ref 11.5–15.5)
WBC: 12.5 10*3/uL — ABNORMAL HIGH (ref 4.0–10.5)
nRBC: 0 % (ref 0.0–0.2)

## 2023-01-17 LAB — COMPREHENSIVE METABOLIC PANEL
ALT: 18 U/L (ref 0–44)
AST: 37 U/L (ref 15–41)
Albumin: 2.9 g/dL — ABNORMAL LOW (ref 3.5–5.0)
Alkaline Phosphatase: 53 U/L (ref 38–126)
Anion gap: 13 (ref 5–15)
BUN: 22 mg/dL (ref 8–23)
CO2: 21 mmol/L — ABNORMAL LOW (ref 22–32)
Calcium: 8.3 mg/dL — ABNORMAL LOW (ref 8.9–10.3)
Chloride: 101 mmol/L (ref 98–111)
Creatinine, Ser: 1.06 mg/dL — ABNORMAL HIGH (ref 0.44–1.00)
GFR, Estimated: 52 mL/min — ABNORMAL LOW (ref >60)
Glucose, Bld: 146 mg/dL — ABNORMAL HIGH (ref 70–99)
Potassium: 3.4 mmol/L — ABNORMAL LOW (ref 3.5–5.1)
Sodium: 135 mmol/L (ref 135–145)
Total Bilirubin: 1 mg/dL (ref 0.3–1.2)
Total Protein: 6.9 g/dL (ref 6.5–8.1)

## 2023-01-17 LAB — URINALYSIS, COMPLETE (UACMP) WITH MICROSCOPIC
Bacteria, UA: NONE SEEN
Bilirubin Urine: NEGATIVE
Glucose, UA: NEGATIVE mg/dL
Ketones, ur: 5 mg/dL — AB
Leukocytes,Ua: NEGATIVE
Nitrite: NEGATIVE
Protein, ur: 30 mg/dL — AB
Specific Gravity, Urine: 1.017 (ref 1.005–1.030)
pH: 5 (ref 5.0–8.0)

## 2023-01-17 LAB — LACTIC ACID, PLASMA
Lactic Acid, Venous: 2.7 mmol/L (ref 0.5–1.9)
Lactic Acid, Venous: 2.4 mmol/L (ref 0.5–1.9)

## 2023-01-17 LAB — PROTIME-INR
INR: 1.8 — ABNORMAL HIGH (ref 0.8–1.2)
Prothrombin Time: 20.7 seconds — ABNORMAL HIGH (ref 11.4–15.2)

## 2023-01-17 LAB — RESP PANEL BY RT-PCR (RSV, FLU A&B, COVID)  RVPGX2
Influenza A by PCR: NEGATIVE
Influenza B by PCR: NEGATIVE
Resp Syncytial Virus by PCR: NEGATIVE
SARS Coronavirus 2 by RT PCR: NEGATIVE

## 2023-01-17 LAB — APTT: aPTT: 37 seconds — ABNORMAL HIGH (ref 24–36)

## 2023-01-17 LAB — SARS CORONAVIRUS 2 BY RT PCR: SARS Coronavirus 2 by RT PCR: NEGATIVE

## 2023-01-17 LAB — MRSA NEXT GEN BY PCR, NASAL: MRSA by PCR Next Gen: NOT DETECTED

## 2023-01-17 MED ORDER — ACETAMINOPHEN 325 MG PO TABS
650.0000 mg | ORAL_TABLET | Freq: Four times a day (QID) | ORAL | Status: DC | PRN
  Administered 2023-01-18 – 2023-01-20 (×2): 650 mg via ORAL
  Filled 2023-01-17 (×3): qty 2

## 2023-01-17 MED ORDER — ONDANSETRON HCL 4 MG/2ML IJ SOLN: 4.0000 mg | Freq: Four times a day (QID) | INTRAMUSCULAR | Status: DC | PRN

## 2023-01-17 MED ORDER — ACETAMINOPHEN 650 MG RE SUPP: 650.0000 mg | Freq: Four times a day (QID) | RECTAL | Status: DC | PRN

## 2023-01-17 MED ORDER — ACETAMINOPHEN 325 MG PO TABS
650.0000 mg | ORAL_TABLET | Freq: Once | ORAL | Status: AC
  Administered 2023-01-17: 650 mg via ORAL

## 2023-01-17 MED ORDER — SODIUM CHLORIDE 0.9 % IV SOLN: Freq: Once | INTRAVENOUS | Status: AC

## 2023-01-17 MED ORDER — SODIUM CHLORIDE 0.9 % IV SOLN
500.0000 mg | INTRAVENOUS | Status: DC
  Administered 2023-01-17 – 2023-01-19 (×3): 500 mg via INTRAVENOUS
  Filled 2023-01-17 (×5): qty 5

## 2023-01-17 MED ORDER — SODIUM CHLORIDE 0.9 % IV SOLN
2.0000 g | INTRAVENOUS | Status: DC
  Administered 2023-01-17 – 2023-01-19 (×3): 2 g via INTRAVENOUS
  Filled 2023-01-17 (×5): qty 20

## 2023-01-17 MED ORDER — METOPROLOL TARTRATE 5 MG/5ML IV SOLN
5.0000 mg | Freq: Three times a day (TID) | INTRAVENOUS | Status: DC | PRN
  Administered 2023-01-17: 5 mg via INTRAVENOUS
  Filled 2023-01-17: qty 5

## 2023-01-17 MED ORDER — ONDANSETRON HCL 4 MG PO TABS: 4.0000 mg | ORAL_TABLET | Freq: Four times a day (QID) | ORAL | Status: DC | PRN

## 2023-01-17 MED ORDER — APIXABAN 2.5 MG PO TABS
2.5000 mg | ORAL_TABLET | Freq: Two times a day (BID) | ORAL | Status: DC
  Administered 2023-01-17 – 2023-01-21 (×8): 2.5 mg via ORAL
  Filled 2023-01-17 (×8): qty 1

## 2023-01-17 MED ORDER — ACETAMINOPHEN 325 MG PO TABS
ORAL_TABLET | ORAL | Status: AC
  Filled 2023-01-17: qty 2

## 2023-01-17 MED ORDER — POLYETHYLENE GLYCOL 3350 17 G PO PACK: 17.0000 g | PACK | Freq: Every day | ORAL | Status: DC | PRN

## 2023-01-17 MED ORDER — APIXABAN 5 MG PO TABS: 5.0000 mg | ORAL_TABLET | Freq: Two times a day (BID) | ORAL | Status: DC

## 2023-01-17 NOTE — ED Triage Notes (Signed)
Pt arrives from [facility] via [facility] and lives at home with c/o UTI x 3 days. Pt was seen at urgent care on Saturday and was Dx with a UTI and has been taking ABX since then, but started feeling worse last night and developed a cough last night. Pt has a Hx of afib and does not take medication to regulate it.

## 2023-01-17 NOTE — ED Notes (Signed)
Hospitalist was at bedside. Pt needed to urinate but then did not.

## 2023-01-17 NOTE — Discharge Instructions (Signed)

## 2023-01-17 NOTE — ED Notes (Signed)
Dr Cyril Loosen informed lactic 2.7

## 2023-01-17 NOTE — ED Provider Notes (Signed)
[facility]    CSN: [id number] Arrival date & time: 01/17/23  1146      History   Chief Complaint Chief Complaint  Patient presents with   Cough    HPI Charlene Anderson is a 82 y.o. female with history of paroxysmal atrial fibrillation, hyperlipidemia, long term use of anticoagulants, and congenital absence of one kidney.  Of note, she is not on a rate controlled medication for atrial fibrillation. Last follow up with cardio was December 2023.  She presents for fatigue and cough for the past few days.  States cough has gotten worse.  Reports increased cough and shortness of breath when lying flat.  Denies any dyspnea on exertion.  Denies chest pain, fever, nasal congestion, sore throat, abdominal pain, vomiting or diarrhea.  Patient was evaluated on 01/14/2023 by another provider in this department and diagnosed with acute cystitis.  She was placed on cefdinir.  At the time of her previous visit she was well-appearing but tachycardic.  She was afebrile.  She is not currently having any dysuria, frequency or urgency but reports difficulty urinating.  Denies any further back/flank pain that she was experiencing a few days ago when she was diagnosed with UTI.  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      Home Medications    Prior to Admission medications   Medication Sig Start Date End Date Taking? Authorizing Provider  cefdinir (OMNICEF) 300 MG capsule Take 1 capsule (300 mg total) by mouth 2 (two) times daily. 01/14/23  Yes Brimage, Vondra, DO  Cholecalciferol (VITAMIN D-1000 MAX ST) 25 MCG (1000 UT) tablet Take by mouth.   Yes [provider]  ELIQUIS 5 MG TABS tablet SMARTSIG:1 Tablet(s) By Mouth Every 12 Hours   Yes [provider]    Family History Family History  Problem Relation Age of Onset   Breast cancer Mother 53   Breast cancer Maternal Aunt 36   Breast cancer Cousin 50       mat cousin    Social History Social History   Tobacco Use   Smoking status: Never   Smokeless tobacco: Never  Vaping Use   Vaping Use: Never used  Substance Use Topics   Alcohol use: Never   Drug use: Never     Allergies   Sulfa antibiotics   Review of Systems Review of Systems  Constitutional:  Positive for chills and fatigue. Negative for fever.  HENT:  Positive for congestion. Negative for rhinorrhea and sore throat.   Respiratory:  Positive for cough and shortness of breath. Negative for chest tightness and wheezing.   Cardiovascular:  Negative for chest pain, palpitations and leg swelling.  Gastrointestinal:  Positive for nausea. Negative for abdominal pain, diarrhea and vomiting.  Genitourinary:  Positive for decreased urine volume and difficulty urinating. Negative for dysuria, flank pain, hematuria and urgency.  Musculoskeletal:  Negative for back pain.  Neurological:  Negative for dizziness, weakness and headaches.     Physical Exam Triage Vital Signs ED Triage Vitals [01/17/23 1209]  Enc Vitals Group     BP      Pulse      Resp      Temp      Temp src      SpO2      Weight      Height      Head Circumference      Peak Flow       Pain Score 0     Pain Loc      Pain Edu?      Excl. in GC?    No data found.  Updated Vital Signs BP 115/74 (BP Location: Left Arm)   Pulse 80   Temp 98.4 F (36.9 C) (Oral)   Resp 16   SpO2 91% Comment: unsure if accurate     Physical Exam Vitals and nursing note reviewed.  Constitutional:      General: She is not in acute distress.    Appearance: Normal appearance. She is ill-appearing. She is not toxic-appearing.  HENT:     Head: Normocephalic and atraumatic.     Nose: Congestion (slight) present.     Mouth/Throat:     Mouth: Mucous membranes are moist.     Pharynx: Oropharynx is clear.  Eyes:     General: No scleral icterus.       Right eye: No discharge.        Left eye: No discharge.     Conjunctiva/sclera: Conjunctivae normal.  Cardiovascular:     Rate and Rhythm: Tachycardia present. Rhythm irregular.  Pulmonary:     Effort: Pulmonary effort is normal. No respiratory distress.     Breath sounds: Wheezing present.     Comments: Somewhat reduced breath sounds throughout right lung. Abdominal:     Palpations: Abdomen is soft.     Tenderness: There is no abdominal tenderness. There is no right CVA tenderness or left CVA tenderness.  Musculoskeletal:     Cervical back: Neck supple.     Right lower leg: No edema.     Left lower leg: No edema.  Skin:    General: Skin is dry.  Neurological:     General: No focal deficit present.     Mental Status: She is alert. Mental status is at baseline.     Motor: No weakness.     Gait: Gait normal.  Psychiatric:        Mood and Affect: Mood normal.        Behavior: Behavior normal.        Thought Content: Thought content normal.      UC Treatments / Results  Labs (all labs ordered are listed, but only abnormal results are displayed) Labs Reviewed  SARS CORONAVIRUS 2 BY RT PCR    EKG   Radiology DG Chest Port 1 View  Result Date: 01/17/2023 CLINICAL DATA:  Sepsis EXAM: PORTABLE CHEST 1 VIEW COMPARISON:   01/17/2023, 03/17/2020 FINDINGS: Slightly worse diffuse airspace opacity throughout the periphery of the right mid and lower lung compatible with pneumonia. Stable left lung aeration. Similar background hyperinflation. Negative for edema,  effusion or pneumothorax. Normal heart size and vascularity. Trachea midline. Aorta atherosclerotic. No acute osseous finding. Degenerative changes of the shoulders. IMPRESSION: Slightly worse right mid and lower lung peripheral airspace opacity compatible with pneumonia. Electronically Signed   By: Judie Petit.  Shick M.D.   On: 01/17/2023 14:54   DG Chest 2 View  Result Date: 01/17/2023 CLINICAL DATA:  Cough, fatigue, nausea EXAM: CHEST - 2 VIEW COMPARISON:  03/17/2020 FINDINGS: The heart size and mediastinal contours are within normal limits. Heterogeneous airspace opacity throughout the peripheral right lower lobe. Pulmonary hyperinflation and emphysema. Nonacute wedge deformity of the midthoracic spine, approximately T7. IMPRESSION: 1. Heterogeneous airspace opacity throughout the peripheral right lower lobe, concerning for infection. Recommend follow-up PA and lateral radiographs in 6-8 weeks to ensure resolution and exclude underlying malignancy. 2.  Pulmonary hyperinflation and emphysema. Electronically Signed   By: Jearld Lesch M.D.   On: 01/17/2023 13:18    Procedures ED EKG  Date/Time: 01/17/2023 1:13 PM  Performed by: Shirlee Latch, PA-C Authorized by: Shirlee Latch, PA-C   Interpretation:    Interpretation: abnormal     Details:  Atrial fibrillation with RVR Rate:    ECG rate:  135 Rhythm:    Rhythm: atrial fibrillation   Ectopy:    Ectopy: none   QRS:    QRS axis:  Normal   QRS intervals:  Normal   QRS conduction: normal   ST segments:    ST segments:  Normal T waves:    T waves: normal   Comments:     Atrial fibrillation with RVR.  (including critical care time)  Medications Ordered in UC Medications - No data to display  Initial  Impression / Assessment and Plan / UC Course  I have reviewed the triage vital signs and the nursing notes.  Pertinent labs & imaging results that were available during my care of the patient were reviewed by me and considered in my medical decision making (see chart for details).   82 year old female presents for continued illness.  At this time she was reporting cough, fatigue, orthopnea over the past several days which is progressively worsened.  No further UTI symptoms other than difficulty urinating.   Seen here on 01/14/2023 and diagnosed with acute cystitis.  Placed on cefdinir.  Review of urine culture shows it was pansensitive.  Patient's pulse rate ranges from 80 to 200 bpm and average is around 130 bpm.  Oxygen is difficult to obtain and ranges from 76% to 91%.  She does not seem overly short of breath.  Is speaking in full sentences and answering all questions.  Increased respiratory rate after returning from x-ray.  Denies shortness of breath.  Exam significant for slight nasal congestion.  She is ill-appearing but nontoxic.  Few scattered wheezes throughout with overall diminished breath sounds of the right lung.  Abdomen soft and nontender.  No CVA tenderness.  COVID test is negative.  Chest x-ray performed.  X-ray shows airspace opacity throughout the right lower lobe which is concerning for infection.  EKG performed shows atrial fibrillation with RVR.  She is not currently on any rate control medication but is on Eliquis.  Spoke with patient and her son and advised that she needs further immediate treatment in the emergency department.  Concern for possible underlying CHF and exacerbation versus pneumonia and uncontrolled heart rate which is likely contributing to her overall fatigue.  She does also have known urinary tract infection. With possibility of second infection and significant  fatigue/weakness I do question possible sepsis. Advised that she likely needs lab work and  monitoring. Possible admission.  The patient is reluctantly agreeable.  EMS contacted.  Acute illness with potential threat to bodily harm.   Final Clinical Impressions(s) / UC Diagnoses   Final diagnoses:  Atrial fibrillation with RVR (HCC)  Acute cough  Orthopnea  Urinary tract infection without hematuria, site unspecified  Pneumonia of right lung due to infectious organism, unspecified part of lung     Discharge Instructions      You have been advised to follow up immediately in the emergency department for concerning signs.symptoms. If you declined EMS transport, please have a family member take you directly to the ED at this time. Do not delay. Based on concerns about condition, if you do not follow up in th e ED, you may risk poor outcomes including worsening of condition, delayed treatment and potentially life threatening issues. If you have declined to go to the ED at this time, you should call your PCP immediately to set up a follow up appointment.  Go to ED for red flag symptoms, including; fevers you cannot reduce with Tylenol/Motrin, severe headaches, vision changes, numbness/weakness in part of the body, lethargy, confusion, intractable vomiting, severe dehydration, chest pain, breathing difficulty, severe persistent abdominal or pelvic pain, signs of severe infection (increased redness, swelling of an area), feeling faint or passing out, dizziness, etc. You should especially go to the ED for sudden acute worsening of condition if you do not elect to go at this time.      ED Prescriptions   None    PDMP not reviewed this encounter.   Shirlee Latch, PA-C 01/17/23 1622

## 2023-01-17 NOTE — ED Provider Notes (Signed)
[facility] Provider Note    Event Date/Time   First MD Initiated Contact with Patient 01/17/23 1402     (approximate)   History   Cough, shortness of breath   HPI  Charlene Anderson is a 82 y.o. female with a history of paroxysmal atrial fibrillation sent from urgent care for evaluation of cough, shortness of breath.  Patient recently treated with cefdinir for UTI.  She went to urgent care today because of severe cough that started over the last 24 to 48 hours.  She does feel mildly short of breath with exertion.  No calf pain or swelling.  Does not know if she has had fevers send from urgent care because of rapid heart rate, chest x-ray consistent with pneumonia     Physical Exam   Triage Vital Signs: ED Triage Vitals  Enc Vitals Group     BP 01/17/23 1407 134/88     Pulse Rate 01/17/23 1407 (!) 142     Resp 01/17/23 1407 (!) 23     Temp 01/17/23 1407 98.2 F (36.8 C)     Temp Source 01/17/23 1407 Oral     SpO2 01/17/23 1407 100 %     Weight 01/17/23 1409 52.2 kg (115 lb)     Height 01/17/23 1409 1.575 m (5\' 2" )     Head Circumference --      Peak Flow --      Pain Score 01/17/23 1409 0     Pain Loc --      Pain Edu? --      Excl. in GC? --     Most recent vital signs: Vitals:   01/17/23 1407 01/17/23 1435  BP: 134/88 121/80  Pulse: (!) 142 (!) 120  Resp: (!) 23 (!) 25  Temp: 98.2 F (36.8 C)   SpO2: 100% 96%     General: Awake, no distress.  CV:  Good peripheral perfusion.  Irregular rhythm, tachycardia Resp:  Mild tachypnea, bibasilar Rales Abd:  No distention.  Other:    ED Results / Procedures / Treatments   Labs (all labs ordered are listed, but only abnormal results are displayed) Labs Reviewed  LACTIC ACID, PLASMA - Abnormal; Notable for the following components:      Result Value   Lactic Acid, Venous 2.7 (*)    All other components within normal limits  COMPREHENSIVE METABOLIC PANEL - Abnormal; Notable for  the following components:   Potassium 3.4 (*)    CO2 21 (*)    Glucose, Bld 146 (*)    Creatinine, Ser 1.06 (*)    Calcium 8.3 (*)    Albumin 2.9 (*)    GFR, Estimated 52 (*)    All other components within normal limits  CBC WITH DIFFERENTIAL/PLATELET - Abnormal; Notable for the following components:   WBC 12.5 (*)    Neutro Abs 9.6 (*)    Monocytes Absolute 1.7 (*)    Abs Immature Granulocytes 0.08 (*)    All other components within normal limits  PROTIME-INR - Abnormal; Notable for the following components:   Prothrombin Time 20.7 (*)    INR 1.8 (*)    All other components within normal limits  APTT - Abnormal; Notable for the following components:   aPTT 37 (*)    All other components within normal limits  RESP PANEL BY RT-PCR (RSV, FLU A&B, COVID)  [id number]  CULTURE, BLOOD (ROUTINE X 2)  CULTURE, BLOOD (ROUTINE X 2)  URINE CULTURE  LACTIC ACID, PLASMA  URINALYSIS, COMPLETE (UACMP) WITH MICROSCOPIC     EKG  ED ECG REPORT I, Jene Every, the attending physician, personally viewed and interpreted this ECG.  Date: 01/17/2023  Rhythm: Atrial fibrillation with RVR QRS Axis: normal Intervals: Abnormal ST/T Wave abnormalities: normal Narrative Interpretation: no evidence of acute ischemia    RADIOLOGY Chest x-ray performed at urgent care consistent with right lower lobe pneumonia    PROCEDURES:  Critical Care performed:   Procedures   MEDICATIONS ORDERED IN ED: Medications  cefTRIAXone (ROCEPHIN) 2 g in sodium chloride 0.9 % 100 mL IVPB (2 g Intravenous New Bag/Given 01/17/23 1441)  azithromycin (ZITHROMAX) 500 mg in sodium chloride 0.9 % 250 mL IVPB (has no administration in time range)  0.9 %  sodium chloride infusion ( Intravenous New Bag/Given 01/17/23 1440)     IMPRESSION / MDM / ASSESSMENT AND PLAN / ED COURSE  I reviewed the triage vital signs and the nursing notes. Patient's presentation is most consistent with acute presentation with potential  threat to life or bodily function.  Patient presents with cough, tachycardia.  EKG is consistent with atrial fibrillation with RVR, patient has a history of paroxysmal A-fib, this is likely instigated by pneumonia as seen on chest x-ray.  Labs obtained, will treat with IV Rocephin, IV azithromycin, IV fluids  With fluids patient's heart rate has decreased to the 115-120 level, will hold on giving any Cardizem at this time given underlying pneumonia/infection/possible sepsis  Lactic acid is elevated at 2.7, blood pressure is normal, no hypotension  I have consulted the hospitalist for admission        FINAL CLINICAL IMPRESSION(S) / ED DIAGNOSES   Final diagnoses:  Sepsis without acute organ dysfunction, due to unspecified organism [facility])  Pneumonia of right lower lobe due to infectious organism     Rx / DC Orders   ED Discharge Orders     None        Note:  This document was prepared using Dragon voice recognition software and may include unintentional dictation errors.   Jene Every, MD 01/17/23 1455

## 2023-01-17 NOTE — Sepsis Progress Note (Signed)
Sepsis protocol monitored by eLink ?

## 2023-01-17 NOTE — ED Triage Notes (Signed)
Pt states that she was seen the other day and she is feeling better as far as kidney pain but now she has developed a cough which is worse at night when lying flat.

## 2023-01-17 NOTE — H&P (Signed)
History and Physical    Patient: Charlene Anderson OZH:[id number] DOB: 06/13/1941 DOA: 01/17/2023 DOS: the patient was seen and examined on 01/17/2023 PCP: Rayetta Humphrey, MD  Patient coming from: Home  Chief Complaint:  Chief Complaint  Patient presents with   Urinary Tract Infection   Cough   HPI: Charlene Anderson is a 82 y.o. female with medical history significant of paroxysmal atrial fibrillation on Eliquis, solitary kidney, psoriasis, sent ED from [facility] with concern for A-fib with RVR and pneumonia. Pt reports slight cough for the past couple of days.  No fever, rhinorrhea, congestion, nausea, vomiting, chest pain or diarrhea. Feels somewhat short of breath when laying flat. She has been taking antibiotics (cefdinir) prescribed for a UTI on 01/14/23. Denies burning with urination but continues to have pain near her right kidney.   Patient ED initials vitals showed she was afebrile, tachycardic (HR 142) and tachypneic (RR 23) normotensive and satting well on room air.  ED workup included COVID, influenza and RSV negative.  Lactic acid 2.7.  CBC showing leukocytosis with left shift (WBC 12.5).  Blood cultures collected.  Urinalysis, respiratory panel pending.  Urgent care x-ray showing air space opacities throughout the right lower lobe concerning for pneumonia which was thought to be worsened on portal CXR. She was started on ceftriaxone and azithromycin and given a 1L bolus of NS. ED physician consulted hosptialist for evaluation for admission.     Review of Systems: As mentioned in the history of present illness. All other systems reviewed and are negative. Past Medical History:  Diagnosis Date   Cancer (HCC)    skin ca   Cardiac murmur    Congenital absence of one kidney    Paroxysmal atrial fibrillation (HCC)    Varicose veins of lower extremity    Past Surgical History:  Procedure Laterality Date   ABDOMINAL HYSTERECTOMY     Social History:  reports that  she has never smoked. She has never used smokeless tobacco. She reports that she does not drink alcohol and does not use drugs.  Allergies  Allergen Reactions   Sulfa Antibiotics Diarrhea    Family History  Problem Relation Age of Onset   Breast cancer Mother 11   Breast cancer Maternal Aunt 13   Breast cancer Cousin 50       mat cousin    Prior to Admission medications   Medication Sig Start Date End Date Taking? Authorizing Provider  cefdinir (OMNICEF) 300 MG capsule Take 1 capsule (300 mg total) by mouth 2 (two) times daily. 01/14/23   Katha Cabal, DO  Cholecalciferol (VITAMIN D-1000 MAX ST) 25 MCG (1000 UT) tablet Take by mouth.    [provider]  ELIQUIS 5 MG TABS tablet SMARTSIG:1 Tablet(s) By Mouth Every 12 Hours    [provider]    Physical Exam: Vitals:   01/17/23 1545 01/17/23 1550 01/17/23 1555 01/17/23 1600  BP:    113/68  Pulse:    71  Resp: (!) 29 (!) 26 (!) 28 (!) 24  Temp:      TempSrc:      SpO2:   99%   Weight:      Height:       GEN:     alert, non-toxic appearing elderly female and no distress    HENT:  mucus membranes moist, oropharyngeal without lesions or erythema,  nares patent, no nasal discharge EYES:   pupils equal and reactive, EOM intact NECK:  supple, good  ROM, no lymphadenopathy  RESP:  Tachypenic, right mid to basilar rales, no retraction, speaking full sentences without pause  CVS:   regular rhythm, tachycardic, distal pulses intact   ABD:  soft, non-tender; bowel sounds present; no palpable masses, EXT:   normal ROM, atraumatic, no LE edema  NEURO:  normal without focal findings,  speech normal, alert and oriented at her baseline Skin:   warm and dry Psych: Normal affect, appropriate speech and behavior   Data Reviewed:  Relevant notes from primary care and specialist visits, past discharge summaries as available in EHR, including Care Everywhere. Prior diagnostic testing as pertinent to current admission  diagnoses Updated medications and problem lists for reconciliation ED course, including vitals, labs, imaging, treatment and response to treatment Triage notes, nursing and pharmacy notes and ED provider's notes Notable results as noted in HPI  Assessment and Plan: Principal Problem:   Atrial fibrillation with rapid ventricular response (HCC) Active Problems:   Sepsis (HCC)   Solitary kidney, congenital   CAP (community acquired pneumonia)    Atrial Fibrillation with Rapid Ventricular Rate  Admission EKG with atrial fibrillation with RVR. Likely due to acute infection.   - Admit to  med-tele - Reduce home Eliquis to 2.5 mg BID  - Metoprolol tartrate 5 mg IV Q8H PRN  - AM EKG  Community-acquired pneumonia Stable on room air.  Tmax 99.23F with productive cough, elevated white blood cell count and CXR concerning for right middle and lower lung pneumonia. Appears to be most likely community-acquired pneumonia.  COVID, influenza and RSV negative. Started on ceftriaxone and azithromycin in the ED. - Tylenol for fever  - Continue ceftriaxone and azithromycin - AM CBC  - MRSA PCR - Follow-up blood cultures - Strep pneumo urinary antigen - Legionella urinary antigen  Sepsis Pt tachycardic, tachypneic with leukocytosis. Suspect 2/2 to pneumonia but urinalysis not collected as of yet. Blood cultures obtained in ED and Ceftriaxone and Azithromycin started by EDP.  - Follow up blood culture and urinalysis. - Trend WBC   Solitary Kidney  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  - Avoid nephrotoxic agent and dehydration  - Trend serum creatine     Advance Care Planning:   Code Status: DNR, Pt has signed DNR at home   Consults: None  Family Communication: None  Severity of Illness: The appropriate patient status for this patient is INPATIENT. Inpatient status is judged to be reasonable and necessary in order to provide the required intensity of service to ensure the patient's safety. The  patient's presenting symptoms, physical exam findings, and initial radiographic and laboratory data in the context of their chronic comorbidities is felt to place them at high risk for further clinical deterioration. Furthermore, it is not anticipated that the patient will be medically stable for discharge from the hospital within 2 midnights of admission.   * I certify that at the point of admission it is my clinical judgment that the patient will require inpatient hospital care spanning beyond 2 midnights from the point of admission due to high intensity of service, high risk for further deterioration and high frequency of surveillance required.*  Author: Katha Cabal, DO 01/17/2023 4:36 PM  For on call review [email or web address].

## 2023-01-17 NOTE — ED Triage Notes (Signed)
Patient is being discharged from the Urgent Care and sent to the Emergency Department via EMS . Per PA, patient is in need of higher level of care due to presentation, VS, abnormal EKG, need for higher level of care. Patient is aware and verbalizes understanding of plan of care.  Vitals:   01/17/23 1213  BP: 115/74  Pulse: 80  Resp: 16  Temp: 98.4 F (36.9 C)

## 2023-01-17 NOTE — Consult Note (Signed)
CODE SEPSIS - PHARMACY COMMUNICATION  **Broad Spectrum Antibiotics should be administered within 1 hour of Sepsis diagnosis**  Time Code Sepsis Called/Page Received: 1417  Antibiotics Ordered: ceftriaxone and azithromycin  Time of 1st antibiotic administration: 1441  Additional action taken by pharmacy: N/A  Barrie Folk ,PharmD Clinical Pharmacist  01/17/2023  2:17 PM

## 2023-01-18 ENCOUNTER — Inpatient Hospital Stay
Admit: 2023-01-18 | Discharge: 2023-01-18 | Disposition: A | Discharge status: Home / Self Care | Payer: Medicare Other | Attending: Cardiology | Admitting: Cardiology

## 2023-01-18 ENCOUNTER — Encounter: Payer: Self-pay | Admitting: Family Medicine

## 2023-01-18 DIAGNOSIS — I4891 Unspecified atrial fibrillation: Secondary | ICD-10-CM | POA: Diagnosis not present

## 2023-01-18 LAB — STREP PNEUMONIAE URINARY ANTIGEN: Strep Pneumo Urinary Antigen: NEGATIVE

## 2023-01-18 LAB — COMPREHENSIVE METABOLIC PANEL
ALT: 20 U/L (ref 0–44)
AST: 34 U/L (ref 15–41)
Albumin: 2.4 g/dL — ABNORMAL LOW (ref 3.5–5.0)
Alkaline Phosphatase: 51 U/L (ref 38–126)
Anion gap: 10 (ref 5–15)
BUN: 18 mg/dL (ref 8–23)
CO2: 20 mmol/L — ABNORMAL LOW (ref 22–32)
Calcium: 7.8 mg/dL — ABNORMAL LOW (ref 8.9–10.3)
Chloride: 107 mmol/L (ref 98–111)
Creatinine, Ser: 0.87 mg/dL (ref 0.44–1.00)
GFR, Estimated: >60 mL/min (ref >60)
Glucose, Bld: 124 mg/dL — ABNORMAL HIGH (ref 70–99)
Potassium: 3.1 mmol/L — ABNORMAL LOW (ref 3.5–5.1)
Sodium: 137 mmol/L (ref 135–145)
Total Bilirubin: 0.8 mg/dL (ref 0.3–1.2)
Total Protein: 5.7 g/dL — ABNORMAL LOW (ref 6.5–8.1)

## 2023-01-18 LAB — CBC
HCT: 36 % (ref 36.0–46.0)
Hemoglobin: 12 g/dL (ref 12.0–15.0)
MCH: 31.6 pg (ref 26.0–34.0)
MCHC: 33.3 g/dL (ref 30.0–36.0)
MCV: 94.7 fL (ref 80.0–100.0)
Platelets: 304 10*3/uL (ref 150–400)
RBC: 3.8 MIL/uL — ABNORMAL LOW (ref 3.87–5.11)
RDW: 13.1 % (ref 11.5–15.5)
WBC: 9.7 10*3/uL (ref 4.0–10.5)
nRBC: 0 % (ref 0.0–0.2)

## 2023-01-18 LAB — ECHOCARDIOGRAM COMPLETE: Height: 62 in

## 2023-01-18 LAB — MAGNESIUM: Magnesium: 2 mg/dL (ref 1.7–2.4)

## 2023-01-18 LAB — CULTURE, BLOOD (ROUTINE X 2)
Culture: NO GROWTH
Culture: NO GROWTH

## 2023-01-18 MED ORDER — DILTIAZEM LOAD VIA INFUSION
10.0000 mg | Freq: Once | INTRAVENOUS | Status: AC
  Administered 2023-01-18: 10 mg via INTRAVENOUS
  Filled 2023-01-18: qty 10

## 2023-01-18 MED ORDER — POTASSIUM CHLORIDE IN NACL 40-0.9 MEQ/L-% IV SOLN
INTRAVENOUS | Status: DC
  Administered 2023-01-18: 125 mL/h via INTRAVENOUS
  Filled 2023-01-18: qty 1000

## 2023-01-18 MED ORDER — DILTIAZEM HCL 30 MG PO TABS
30.0000 mg | ORAL_TABLET | Freq: Four times a day (QID) | ORAL | Status: DC
  Administered 2023-01-18 – 2023-01-20 (×8): 30 mg via ORAL
  Filled 2023-01-18 (×8): qty 1

## 2023-01-18 MED ORDER — PANTOPRAZOLE SODIUM 40 MG PO TBEC
40.0000 mg | DELAYED_RELEASE_TABLET | Freq: Every day | ORAL | Status: DC
  Administered 2023-01-18 – 2023-01-21 (×4): 40 mg via ORAL
  Filled 2023-01-18 (×4): qty 1

## 2023-01-18 MED ORDER — ENSURE ENLIVE PO LIQD
237.0000 mL | Freq: Three times a day (TID) | ORAL | Status: DC
  Administered 2023-01-19 – 2023-01-20 (×6): 237 mL via ORAL

## 2023-01-18 MED ORDER — DILTIAZEM HCL-DEXTROSE 125-5 MG/125ML-% IV SOLN (PREMIX)
5.0000 mg/h | INTRAVENOUS | Status: DC
  Administered 2023-01-18: 5 mg/h via INTRAVENOUS
  Filled 2023-01-18: qty 125

## 2023-01-18 MED ORDER — DILTIAZEM HCL 25 MG/5ML IV SOLN
INTRAVENOUS | Status: AC
  Filled 2023-01-18: qty 5

## 2023-01-18 NOTE — Progress Notes (Signed)
Progress Note   Patient: Charlene Anderson ZOX:[id number] DOB: 01/07/1941 DOA: 01/17/2023     1 DOS: the patient was seen and examined on 01/18/2023   Brief hospital course:   Charlene Anderson is a 82 y.o. female with medical history significant of paroxysmal atrial fibrillation on Eliquis, solitary kidney, psoriasis, sent ED from [facility] with concern for A-fib with RVR and pneumonia. Pt reports slight cough for the past couple of days.  No fever, rhinorrhea, congestion, nausea, vomiting, chest pain or diarrhea. Feels somewhat short of breath when laying flat. She has been taking antibiotics (cefdinir) prescribed for a UTI on 01/14/23. Denies burning with urination but continues to have pain near her right kidney.    Patient ED initials vitals showed she was afebrile, tachycardic (HR 142) and tachypneic (RR 23) normotensive and satting well on room air.   ED workup included COVID, influenza and RSV negative.  Lactic acid 2.7.  CBC showing leukocytosis with left shift (WBC 12.5).  Blood cultures collected.  Urinalysis, respiratory panel pending.  Urgent care x-ray showing air space opacities throughout the right lower lobe concerning for pneumonia which was thought to be worsened on portal CXR. She was started on ceftriaxone and azithromycin and given a 1L bolus of NS. ED physician consulted hosptialist for evaluation for admission.     Assessment and Plan: Sepsis secondary to community-acquired pneumonia Acute respiratory failure As evidenced by fever with a Tmax of 102.2, marked leukocytosis, lactic acidosis and chest x-ray findings concerning for right middle and lower lobe pneumonia With acute respiratory failure (room air pulse oximetry of 88%) Continue aggressive IV fluid resuscitation Continue antibiotic therapy with Rocephin and Zithromax Oxygen supplementation to maintain pulse oximetry greater than 92%   Atrial Fibrillation with Rapid Ventricular Rate  Most likely  secondary to acute infection Start patient on Cardizem drip and titrate for heart rate less than 100 Continue Eliquis to 2.5 mg BID as primary prophylaxis for an acute stroke      Solitary Kidney  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  - Avoid nephrotoxic agent and dehydration  - Trend serum creatine     Hypokalemia Supplement potassium Check magnesium levels       Subjective: Patient is seen and examined at the bedside.   Physical Exam: Vitals:   01/18/23 1035 01/18/23 1039 01/18/23 1200 01/18/23 1241  BP: 105/64 105/64  113/85  Pulse:  (!) 111  (!) 106  Resp:  16  18  Temp:   97.9 F (36.6 C) 98.1 F (36.7 C)  TempSrc:      SpO2:  95%  96%  Weight:      Height:       GEN:     alert, non-toxic appearing elderly female and no distress     HENT:  mucus membranes moist, oropharyngeal without lesions or erythema,  nares patent, no nasal discharge EYES:   pupils equal and reactive, EOM intact NECK:  supple, good ROM, no lymphadenopathy  RESP:  Tachypenic, right mid to basilar rales, no retraction, speaking full sentences without pause  CVS:    Irregularly irregular, tachycardic ABD:    soft, non-tender; bowel sounds present; no palpable masses, EXT:     normal ROM, atraumatic, no LE edema  NEURO:  normal without focal findings,  speech normal, alert and oriented at her baseline Skin:    warm and dry Psych: Normal affect, appropriate speech and behavior  Data Reviewed: Labs reviewed.  Potassium 3.1, magnesium 2.0,  white count 12.4 >> 9.7 There are no new results to review at this time.  Family Communication:   Disposition: Status is: Inpatient Remains inpatient appropriate because: Requires IV antibiotic therapy  Planned Discharge Destination:  TBD    Time spent: 35 minutes  Author: Lucile Shutters, MD 01/18/2023 2:34 PM  For on call review [email or web address].

## 2023-01-18 NOTE — Consult Note (Signed)
[facility] CLINIC CARDIOLOGY CONSULT NOTE       Patient ID: Charlene Anderson MRN: [id number] DOB/AGE: 09-14-1940 82 y.o.  Admit date: 01/17/2023 Referring Physician Dr.  Joylene Igo Primary Physician Dr. Angus Palms Primary Cardiologist previous Dr. Lady Gary Reason for Consultation AF RVR  HPI: Winner Bergland. Risse is an 82yoF with a PMH paroxysmal AF (dose reduced Eliquis), congenital solitary kidney who presented to [facility] ED 01/17/2023 from an urgent care and [place] with concern for tachycardia and community-acquired pneumonia.  Cardiology is consulted for assistance with her AF.  History is obtained from the patient and her chart.  Patient tells me she felt like she had a urinary tract infection where she has right-sided back pain and dysuria and was prescribed cefdinir on 6/1.  She presented to urgent care with a couple days of cough and shortness of breath when lying flat but denied chest pain or palpitations.  She was febrile overnight to 102.2 and started on empiric ceftriaxone and azithromycin, and given a liter bolus of normal saline in the emergency department.  CBC was significant for leukocytosis to 12.5 K with a left shift, lactate was elevated at 2.7, COVID, flu, and RSV viral panel were negative.  EKG showed atrial fibrillation with RVR with rate in the 130s and she was started on a diltiazem infusion.  At my time of evaluation this morning she is laying in bed supported by 2 pillows on oxygen by nasal cannula (no O2 requirement at baseline).  She continues to have a dry cough that is bothersome and still admits to pain in her back on the right.  Overall feels improved compared to when she first presented to her baseline.  She denies chest pain or heart racing.  She has chronic lower extremity edema that appears to be at her baseline.  Recent vitals are notable for a blood pressure of 113/85, heart rate in the high 90s to low 100s with short paroxysms to the 110s in atrial fibrillation on  telemetry.  Labs notable for hypokalemia to 3.1, BUN/creatinine 18/0.87 and GFR >60.  WBCs downtrending from 12.5--9.7.  Review of systems complete and found to be negative unless listed above     Past Medical History:  Diagnosis Date   Cancer (HCC)    skin ca   Cardiac murmur    Congenital absence of one kidney    Paroxysmal atrial fibrillation (HCC)    Varicose veins of lower extremity     Past Surgical History:  Procedure Laterality Date   ABDOMINAL HYSTERECTOMY      Medications Prior to Admission  Medication Sig Dispense Refill Last Dose   Cholecalciferol (VITAMIN D-1000 MAX ST) 25 MCG (1000 UT) tablet Take by mouth.   01/16/2023   ELIQUIS 5 MG TABS tablet SMARTSIG:1 Tablet(s) By Mouth Every 12 Hours   01/16/2023   Social History   Socioeconomic History   Marital status: Widowed    Spouse name: Not on file   Number of children: Not on file   Years of education: Not on file   Highest education level: Not on file  Occupational History   Not on file  Tobacco Use   Smoking status: Never   Smokeless tobacco: Never  Vaping Use   Vaping Use: Never used  Substance and Sexual Activity   Alcohol use: Never   Drug use: Never   Sexual activity: Not Currently  Other Topics Concern   Not on file  Social History Narrative   Not on file  Social Determinants of Health   Financial Resource Strain: Not on file  Food Insecurity: No Food Insecurity (01/18/2023)   Hunger Vital Sign    Worried About Running Out of Food in the Last Year: Never true    Ran Out of Food in the Last Year: Never true  Transportation Needs: No Transportation Needs (01/18/2023)   PRAPARE - Administrator, Civil Service (Medical): No    Lack of Transportation (Non-Medical): No  Physical Activity: Not on file  Stress: Not on file  Social Connections: Not on file  Intimate Partner Violence: Not At Risk (01/18/2023)   Humiliation, Afraid, Rape, and Kick questionnaire    Fear of Current or  Ex-Partner: No    Emotionally Abused: No    Physically Abused: No    Sexually Abused: No    Family History  Problem Relation Age of Onset   Breast cancer Mother 64   Breast cancer Maternal Aunt 69   Breast cancer Cousin 50       mat cousin      Intake/Output Summary (Last 24 hours) at 01/18/2023 1442 Last data filed at 01/18/2023 1034 Gross per 24 hour  Intake 1522.44 ml  Output --  Net 1522.44 ml    Vitals:   01/18/23 1035 01/18/23 1039 01/18/23 1200 01/18/23 1241  BP: 105/64 105/64  113/85  Pulse:  (!) 111  (!) 106  Resp:  16  18  Temp:   97.9 F (36.6 C) 98.1 F (36.7 C)  TempSrc:      SpO2:  95%  96%  Weight:      Height:        PHYSICAL EXAM General: Pleasant ill-appearing elderly female, well nourished, in no acute distress.  Sitting at incline in bed HEENT:  Normocephalic and atraumatic. Neck:  No JVD.  Lungs: Normal respiratory effort on 2L by [place].  Rhonchi on the right without appreciable crackles or wheezes.  Dry cough with deep inspiration. Heart: Irregularly irregular with generally controlled rate. Normal S1 and S2 without gallops or murmurs.  Abdomen: Non-distended appearing.  Msk: Normal strength and tone for age. Extremities: Warm and well perfused. No clubbing, cyanosis.  Trace bilateral lower extremity edema.  Neuro: Alert and oriented X 3. Psych:  Answers questions appropriately.   Labs: Basic Metabolic Panel: Recent Labs    01/17/23 1417 01/18/23 0614  NA 135 137  K 3.4* 3.1*  CL 101 107  CO2 21* 20*  GLUCOSE 146* 124*  BUN 22 18  CREATININE 1.06* 0.87  CALCIUM 8.3* 7.8*  MG  --  2.0   Liver Function Tests: Recent Labs    01/17/23 1417 01/18/23 0614  AST 37 34  ALT 18 20  ALKPHOS 53 51  BILITOT 1.0 0.8  PROT 6.9 5.7*  ALBUMIN 2.9* 2.4*   No results for input(s): "LIPASE", "AMYLASE" in the last 72 hours. CBC: Recent Labs    01/17/23 1417 01/18/23 0614  WBC 12.5* 9.7  NEUTROABS 9.6*  --   HGB 13.0 12.0  HCT 39.9 36.0   MCV 96.4 94.7  PLT 329 304   Cardiac Enzymes: No results for input(s): "CKTOTAL", "CKMB", "CKMBINDEX", "TROPONINIHS" in the last 72 hours. BNP: No results for input(s): "BNP" in the last 72 hours. D-Dimer: No results for input(s): "DDIMER" in the last 72 hours. Hemoglobin A1C: No results for input(s): "HGBA1C" in the last 72 hours. Fasting Lipid Panel: No results for input(s): "CHOL", "HDL", "LDLCALC", "TRIG", "CHOLHDL", "LDLDIRECT" in the last  72 hours. Thyroid Function Tests: No results for input(s): "TSH", "T4TOTAL", "T3FREE", "THYROIDAB" in the last 72 hours.  Invalid input(s): "FREET3" Anemia Panel: No results for input(s): "VITAMINB12", "FOLATE", "FERRITIN", "TIBC", "IRON", "RETICCTPCT" in the last 72 hours.   Radiology: [facility] Chest Port 1 View  Result Date: 01/17/2023 CLINICAL DATA:  Sepsis EXAM: PORTABLE CHEST 1 VIEW COMPARISON:  01/17/2023, 03/17/2020 FINDINGS: Slightly worse diffuse airspace opacity throughout the periphery of the right mid and lower lung compatible with pneumonia. Stable left lung aeration. Similar background hyperinflation. Negative for edema, effusion or pneumothorax. Normal heart size and vascularity. Trachea midline. Aorta atherosclerotic. No acute osseous finding. Degenerative changes of the shoulders. IMPRESSION: Slightly worse right mid and lower lung peripheral airspace opacity compatible with pneumonia. Electronically Signed   By: Judie Petit.  Shick M.D.   On: 01/17/2023 14:54   DG Chest 2 View  Result Date: 01/17/2023 CLINICAL DATA:  Cough, fatigue, nausea EXAM: CHEST - 2 VIEW COMPARISON:  03/17/2020 FINDINGS: The heart size and mediastinal contours are within normal limits. Heterogeneous airspace opacity throughout the peripheral right lower lobe. Pulmonary hyperinflation and emphysema. Nonacute wedge deformity of the midthoracic spine, approximately T7. IMPRESSION: 1. Heterogeneous airspace opacity throughout the peripheral right lower lobe, concerning for  infection. Recommend follow-up PA and lateral radiographs in 6-8 weeks to ensure resolution and exclude underlying malignancy. 2.  Pulmonary hyperinflation and emphysema. Electronically Signed   By: Jearld Lesch M.D.   On: 01/17/2023 13:18    ECHO no recent study available for review   TELEMETRY reviewed by me (LT) 01/18/2023 : AF rate 130s overnight, now better controlled in the high 90s, low 100s  EKG reviewed by me: AF 99BPM   Data reviewed by me (LT) 01/18/2023: Last outpatient cardiology clinic note, ED note, admission H&P last 24h vitals tele labs imaging I/O   Principal Problem:   Atrial fibrillation with rapid ventricular response (HCC) Active Problems:   Sepsis (HCC)   Solitary kidney, congenital   CAP (community acquired pneumonia)    ASSESSMENT AND PLAN:  Charlene Anderson is an 1yoF with a PMH paroxysmal AF (dose reduced Eliquis), congenital solitary kidney who presented to [facility] ED 01/17/2023 from an urgent care and [place] with concern for tachycardia and community-acquired pneumonia.  Cardiology is consulted for assistance with her AF.  # Community-acquired pneumonia Presents with several days of coughing, chills, leukocytosis to 12 K, and febrile to 102.2 degrees F overnight, with CXR c/f RML and RLL opacity 2L oxygen requirement with none at baseline.  -On azithromycin and ceftriaxone per primary -Oxygen as tolerated  # Paroxysmal AF RVR # Hypokalemia Initially in RVR with rates in the 130s.  Suspect provoked in the setting of fever and CAP above, rate controlled after diltiazem infusion and replenishing electrolytes. -Continue to treat causes of increased adrenergic tone including fever, infection, pain, electrolyte disturbances, etc. -Change diltiazem infusion to p.o. diltiazem 30 mg every 6 hours with goals to consolidate -Continue Eliquis 2.5 mg twice daily for stroke prevention (age >27, weight <60 Kgs) -Monitor and replenish electrolytes for goal K >4, mag >2 -Echo  complete  This patient's plan of care was discussed and created with Dr. Darrold Junker and he is in agreement.  Signed: Rebeca Allegra , PA-C 01/18/2023, 2:42 PM [facility] Card[facility]

## 2023-01-18 NOTE — Plan of Care (Signed)
  Problem: Activity: Goal: Ability to tolerate increased activity will improve 01/18/2023 1832 by Delila Spence, RN Outcome: Progressing 01/18/2023 1832 by Delila Spence, RN Outcome: Progressing 01/18/2023 1831 by Delila Spence, RN Outcome: Progressing   Problem: Clinical Measurements: Goal: Ability to maintain a body temperature in the normal range will improve 01/18/2023 1832 by Delila Spence, RN Outcome: Progressing 01/18/2023 1831 by Delila Spence, RN Outcome: Progressing   Problem: Respiratory: Goal: Ability to maintain adequate ventilation will improve 01/18/2023 1832 by Delila Spence, RN Outcome: Progressing 01/18/2023 1832 by Delila Spence, RN Outcome: Progressing 01/18/2023 1831 by Delila Spence, RN Outcome: Progressing Goal: Ability to maintain a clear airway will improve 01/18/2023 1832 by Delila Spence, RN Outcome: Progressing 01/18/2023 1831 by Delila Spence, RN Outcome: Progressing   Problem: Education: Goal: Knowledge of General Education information will improve Description: Including pain rating scale, medication(s)/side effects and non-pharmacologic comfort measures 01/18/2023 1832 by Delila Spence, RN Outcome: Progressing 01/18/2023 1831 by Delila Spence, RN Outcome: Progressing   Problem: Health Behavior/Discharge Planning: Goal: Ability to manage health-related needs will improve 01/18/2023 1832 by Delila Spence, RN Outcome: Progressing 01/18/2023 1831 by Delila Spence, RN Outcome: Progressing   Problem: Clinical Measurements: Goal: Ability to maintain clinical measurements within normal limits will improve 01/18/2023 1832 by Delila Spence, RN Outcome: Progressing 01/18/2023 1831 by Delila Spence, RN Outcome: Progressing Goal: Will remain free from infection 01/18/2023 1832 by Delila Spence, RN Outcome: Progressing 01/18/2023 1832 by Delila Spence, RN Outcome: Progressing 01/18/2023 1831 by Shella Maxim  D, RN Outcome: Progressing Goal: Diagnostic test results will improve 01/18/2023 1832 by Delila Spence, RN Outcome: Progressing 01/18/2023 1831 by Shella Maxim D, RN Outcome: Progressing Goal: Respiratory complications will improve 01/18/2023 1832 by Delila Spence, RN Outcome: Progressing 01/18/2023 1831 by Shella Maxim D, RN Outcome: Progressing Goal: Cardiovascular complication will be avoided 01/18/2023 1832 by Delila Spence, RN Outcome: Progressing 01/18/2023 1831 by Delila Spence, RN Outcome: Progressing   Problem: Activity: Goal: Risk for activity intolerance will decrease 01/18/2023 1832 by Delila Spence, RN Outcome: Progressing 01/18/2023 1831 by Delila Spence, RN Outcome: Progressing   Problem: Nutrition: Goal: Adequate nutrition will be maintained 01/18/2023 1832 by Delila Spence, RN Outcome: Progressing 01/18/2023 1831 by Delila Spence, RN Outcome: Progressing   Problem: Coping: Goal: Level of anxiety will decrease 01/18/2023 1832 by Delila Spence, RN Outcome: Progressing 01/18/2023 1831 by Delila Spence, RN Outcome: Progressing   Problem: Elimination: Goal: Will not experience complications related to bowel motility 01/18/2023 1832 by Delila Spence, RN Outcome: Progressing 01/18/2023 1831 by Delila Spence, RN Outcome: Progressing Goal: Will not experience complications related to urinary retention 01/18/2023 1832 by Delila Spence, RN Outcome: Progressing 01/18/2023 1831 by Delila Spence, RN Outcome: Progressing   Problem: Pain Managment: Goal: General experience of comfort will improve 01/18/2023 1832 by Delila Spence, RN Outcome: Progressing 01/18/2023 1831 by Delila Spence, RN Outcome: Progressing   Problem: Safety: Goal: Ability to remain free from injury will improve 01/18/2023 1832 by Delila Spence, RN Outcome: Progressing 01/18/2023 1831 by Shella Maxim D, RN Outcome: Progressing   Problem: Skin  Integrity: Goal: Risk for impaired skin integrity will decrease 01/18/2023 1832 by Delila Spence, RN Outcome: Progressing 01/18/2023 1831 by Delila Spence, RN Outcome: Progressing

## 2023-01-18 NOTE — Plan of Care (Signed)
Patient A&Ox4, from home, independent in room

## 2023-01-18 NOTE — Evaluation (Addendum)
Clinical/Bedside Swallow Evaluation Patient Details  Name: Charlene Anderson MRN: [id number] Date of Birth: [birth year]  Today's Date: 01/18/2023 Time: SLP Start Time (ACUTE ONLY): 1450 SLP Stop Time (ACUTE ONLY): 1545 SLP Time Calculation (min) (ACUTE ONLY): 55 min  Past Medical History:  Past Medical History:  Diagnosis Date   Cancer (HCC)    skin ca   Cardiac murmur    Congenital absence of one kidney    Paroxysmal atrial fibrillation (HCC)    Varicose veins of lower extremity    Past Surgical History:  Past Surgical History:  Procedure Laterality Date   ABDOMINAL HYSTERECTOMY     HPI:  Pt is a 82 y.o. female with medical history significant of paroxysmal atrial fibrillation on Eliquis, solitary kidney, psoriasis, sent ED from [facility] with concern for A-fib with RVR and pneumonia.  Patient was evaluated on 01/14/2023 by another provider in this department and diagnosed with acute cystitis.  She was placed on cefdinir.  At the time of her previous visit she was well-appearing but tachycardic.  She was afebrile.  She presents for fatigue and cough for the past few days.  CXR:  right mid and lower lung peripheral airspace opacity  compatible with pneumonia. Pt has had No prior CXR imaging since 2021.    Assessment / Plan / Recommendation  Clinical Impression   Pt seen for BSE today. Pt awake, verbal and conversed easily w/ this SLP and her Family in the room. She had a nonproductive cough at Baseline PRIOR TO ANY PO's(intermittently, and more when taking). Pt sitting in chair. She and Family present denied any trouble swallowing but DID endorse s/s of REFLUX activity at home w/ pt stating she "felt it in my throat at night sometimes" -- more recently in past 1-2 weeks prior to this admit also. She stated she is NOT on a PPI but takes "a TUMS when the Reflux happens".  On [place] O2 support 2L; afebrile. WBC WNL.  OF NOTE: Pt strongly endorse s/s of REFLUX at home; self-medicates w/  TUMS. She reports episodes "during the night" that awaken her w/ coughing and "feeling it in my throat". W/ a baseline presentation of REFLUX activity and episodes of REFLUX behavior, especially at night, ANY Dysmotility or Regurgitation of Reflux material can increase risk for aspiration of the Reflux material during Retrograde flow thus impact Voicing and Pulmonary status. Pt should f/u w/ GI re: this.   Pt appears to present w/ funcational oropharyngeal phase swallowing w/ No oropharyngeal phase dysphagia noted, No neuromuscular deficits noted. Pt consumed po trials w/ No immediate, overt clinical s/s of aspiration during po trials.  Pt appears at reduced risk for aspiration following general aspiration precautions.  However, pt does have challenging factors that could impact her oropharyngeal swallowing to include suspected REFLUX w/ s/s described including Retrograde activity to the throat/pharynx of REFLUX material during the night, deconditioning/weakness, recent illness, and advanced age. These factors can increase risk for aspiration of REFLUX material, dysphagia, as well as decreased oral intake overall.   During po trials, pt consumed all consistencies w/ no overt coughing, decline in vocal quality, or change in respiratory presentation during/post trials. Oral phase appeared [facility] w/ timely bolus management, mastication, and control of bolus propulsion for A-P transfer for swallowing. Oral clearing achieved w/ all trial consistencies -- moistened, soft foods given.  OM Exam appeared [facility] w/ no unilateral weakness noted. Speech Clear. Pt fed self w/ setup support.  Recommend continue a fairly Regular  consistency diet w/ well-Cut meats, moistened foods; Thin liquids -- less straw use to reduce air swallowed. Recommend general aspiration precautions, tray setup and positioning support as needed. STRICT REFLUX PRECAUTIONS including NOT eating 2-3 hours PRIOR to bed time. Pills WHOLE in Puree for  safer, easier swallowing if needed per NSG.   Education given on Pills in Puree; food consistencies and easy to eat options; general aspiration precautions, STRICT REFLUX PRECAUTIONS and Handouts, instruction on use of Incentive Spirometer to better support Pulmonary ex/health to pt and Dtr/Son present in room. Recommended GI f/u for formal assessment of potential REFLUX activity, PPI/tx management and f/u as well as further education.  No further skilled ST services indicated. NSG to reconsult if any new needs arise. MD/NSG updated, agreed. MD updated on need to address suspected REFLUX activity; GI f/u. Recommend Dietician f/u for support. SLP Visit Diagnosis: Dysphagia, unspecified (R13.10) (strongly suspect Esophageal phase Dysmotility; REFLUX activity per pt's description of night-time activity)    Aspiration Risk   (reduced from an oropharyngeal phase standpoint but suspect there is REFLUX aspiration risk present per s/s)    Diet Recommendation   continue a fairly Regular consistency diet w/ well-Cut meats, moistened foods; Thin liquids -- less straw use to reduce air swallowed. Recommend general aspiration precautions, tray setup and positioning support as needed. STRICT REFLUX PRECAUTIONS including NOT eating 2-3 hours PRIOR to bed time.   Medication Administration: Whole meds with puree (for safer swallowing as needed)    Other  Recommendations Recommended Consults: Consider GI evaluation;Consider esophageal assessment (Dietician f/u if needed) Oral Care Recommendations: Oral care BID;Oral care before and after PO;Patient independent with oral care (setup support)    Recommendations for follow up therapy are one component of a multi-disciplinary discharge planning process, led by the attending physician.  Recommendations may be updated based on patient status, additional functional criteria and insurance authorization.  Follow up Recommendations No SLP follow up      Assistance  Recommended at Discharge  Prn-intermittent  Functional Status Assessment Patient has had a recent decline in their functional status and demonstrates the ability to make significant improvements in function in a reasonable and predictable amount of time.  Frequency and Duration  (n/a)   (n/a)       Prognosis Prognosis for improved oropharyngeal function: Good Barriers to Reach Goals: Time post onset;Severity of deficits Barriers/Prognosis Comment: strongly suspect Esophageal phase Dysmotility; REFLUX activity per pt's description of night-time activity      Swallow Study   General Date of Onset: 01/17/23 HPI: Pt is a 82 y.o. female with medical history significant of paroxysmal atrial fibrillation on Eliquis, solitary kidney, psoriasis, sent ED from [facility] with concern for A-fib with RVR and pneumonia.  Patient was evaluated on 01/14/2023 by another provider in this department and diagnosed with acute cystitis.  She was placed on cefdinir.  At the time of her previous visit she was well-appearing but tachycardic.  She was afebrile.  She presents for fatigue and cough for the past few days.  CXR:  right mid and lower lung peripheral airspace opacity  compatible with pneumonia. Pt has had No prior CXR imaging since 2021. Type of Study: Bedside Swallow Evaluation Previous Swallow Assessment: none Diet Prior to this Study: Regular;Thin liquids (Level 0) Temperature Spikes Noted: No (wbc 9.7) Respiratory Status: Nasal cannula (2L) History of Recent Intubation: No Behavior/Cognition: Alert;Cooperative;Pleasant mood Oral Cavity Assessment: Within Functional Limits Oral Care Completed by SLP: Recent completion by staff Oral Cavity -  Dentition: Adequate natural dentition Vision: Functional for self-feeding Self-Feeding Abilities: Able to feed self;Needs set up Patient Positioning: Upright in chair Baseline Vocal Quality: Normal Volitional Cough: Strong Volitional Swallow: Able to elicit     Oral/Motor/Sensory Function Overall Oral Motor/Sensory Function: Within functional limits   Ice Chips Ice chips: Within functional limits Presentation: Spoon (fed; 2 trials)   Thin Liquid Thin Liquid: Within functional limits Presentation: Cup;Self Fed;Straw (5-6 trials via each method)    Nectar Thick Nectar Thick Liquid: Not tested   Honey Thick Honey Thick Liquid: Not tested   Puree Puree: Within functional limits Presentation: Self Fed;Spoon (5 trials)   Solid     Solid: Within functional limits Presentation: Self Fed (4-5 trials) Other Comments: moistened        Jerilynn Som, MS, CCC-SLP Speech Language Pathologist Rehab Services; [facility] [phone] (ascom) Malillany Kazlauskas 01/18/2023,6:31 PM

## 2023-01-18 NOTE — Evaluation (Signed)
Physical Therapy Evaluation Patient Details Name: Charlene Anderson MRN: [id number] DOB: 1941-03-09 Today's Date: 01/18/2023  History of Present Illness  Charlene Anderson is a 82 y.o. female with medical history significant of paroxysmal atrial fibrillation on Eliquis, solitary kidney, psoriasis, sent ED from [facility] with concern for A-fib with RVR and pneumonia.   Clinical Impression  Patient received in bed, agrees to PT assessment. Patient is independent with bed mobility. Transfers with supervision. Requires at least single UE support when moving around room. Coughing during session. She will continue to benefit from skilled PT to improve strength, endurance and safetty.       Recommendations for follow up therapy are one component of a multi-disciplinary discharge planning process, led by the attending physician.  Recommendations may be updated based on patient status, additional functional criteria and insurance authorization.  Follow Up Recommendations       Assistance Recommended at Discharge Frequent or constant Supervision/Assistance  Patient can return home with the following  A little help with walking and/or transfers;A little help with bathing/dressing/bathroom;Assist for transportation;Assistance with cooking/housework;Help with stairs or ramp for entrance    Equipment Recommendations Other (comment);None recommended by PT (TBD)  Recommendations for Other Services       Functional Status Assessment Patient has had a recent decline in their functional status and demonstrates the ability to make significant improvements in function in a reasonable and predictable amount of time.     Precautions / Restrictions Precautions Precautions: Fall Precaution Comments: Monitor HR-difficult to get reading, poor pleth Restrictions Weight Bearing Restrictions: No      Mobility  Bed Mobility Overal bed mobility: Modified Independent             General bed  mobility comments: HOB elevated    Transfers Overall transfer level: Needs assistance Equipment used: None Transfers: Sit to/from Stand Sit to Stand: Supervision                Ambulation/Gait Ambulation/Gait assistance: Min guard Gait Distance (Feet): 15 Feet Assistive device: None Gait Pattern/deviations: Step-through pattern Gait velocity: decr     General Gait Details: patient benefits from at least 1 UE support at this time due to weakness  Stairs            Wheelchair Mobility    Modified Rankin (Stroke Patients Only)       Balance Overall balance assessment: Needs assistance Sitting-balance support: Feet supported Sitting balance-Leahy Scale: Normal     Standing balance support: Single extremity supported, During functional activity Standing balance-Leahy Scale: Good                               Pertinent Vitals/Pain Pain Assessment Pain Assessment: No/denies pain    Home Living Family/patient expects to be discharged to:: Private residence Living Arrangements: Children Available Help at Discharge: Family;Available 24 hours/day Type of Home: House Home Access: Stairs to enter Entrance Stairs-Rails: Right;Left;Can reach both Entrance Stairs-Number of Steps: 2 in back 4 in the front   Home Layout: One level Home Equipment: Hand held shower head Additional Comments: walking stick    Prior Function Prior Level of Function : Independent/Modified Independent             Mobility Comments: Independent uses walking stick when she walk around her neighborhood ADLs Comments: [name], Goes to the gym 3 days/week, gardens     Hand Dominance   Dominant Hand: Right  Extremity/Trunk Assessment   Upper Extremity Assessment Upper Extremity Assessment: Defer to OT evaluation    Lower Extremity Assessment Lower Extremity Assessment: Generalized weakness    Cervical / Trunk Assessment Cervical / Trunk Assessment: Normal   Communication   Communication: No difficulties  Cognition Arousal/Alertness: Awake/alert Behavior During Therapy: WFL for tasks assessed/performed Overall Cognitive Status: Within Functional Limits for tasks assessed                                          General Comments General comments (skin integrity, edema, etc.): Pt HR noted to reach 140's with minimal activity (trasnfer to/from [facility]) RN notified/aware. spO2 remains WFL ( >/= 92%) with pt on 2L [place] t/o session.    Exercises     Assessment/Plan    PT Assessment Patient needs continued PT services  PT Problem List Decreased strength;Decreased activity tolerance;Decreased mobility;Decreased balance;Cardiopulmonary status limiting activity       PT Treatment Interventions DME instruction;Therapeutic exercise;Gait training;Balance training;Functional mobility training;Stair training;Therapeutic activities;Patient/family education    PT Goals (Current goals can be found in the Care Plan section)  Acute Rehab PT Goals Patient Stated Goal: return home PT Goal Formulation: With patient/family Time For Goal Achievement: 02/01/23 Potential to Achieve Goals: Good    Frequency Min 3X/week     Co-evaluation               AM-PAC PT "6 Clicks" Mobility  Outcome Measure Help needed turning from your back to your side while in a flat bed without using bedrails?: None Help needed moving from lying on your back to sitting on the side of a flat bed without using bedrails?: None Help needed moving to and from a bed to a chair (including a wheelchair)?: A Little Help needed standing up from a chair using your arms (e.g., wheelchair or bedside chair)?: A Little Help needed to walk in hospital room?: A Little Help needed climbing 3-5 steps with a railing? : A Little 6 Click Score: 20    End of Session Equipment Utilized During Treatment: Oxygen Activity Tolerance: Patient tolerated treatment well Patient left:  in chair;with call bell/phone within reach;with family/visitor present Nurse Communication: Mobility status PT Visit Diagnosis: Unsteadiness on feet (R26.81);Other abnormalities of gait and mobility (R26.89);Muscle weakness (generalized) (M62.81);Difficulty in walking, not elsewhere classified (R26.2)    Time: 1357-1410 PT Time Calculation (min) (ACUTE ONLY): 13 min   Charges:   PT Evaluation $PT Eval Low Complexity: 1 Low          Frederick Klinger, PT, GCS 01/18/23,2:20 PM

## 2023-01-18 NOTE — Evaluation (Signed)
Occupational Therapy Evaluation Patient Details Name: Charlene Anderson MRN: [id number] DOB: Jan 05, 1941 Today's Date: 01/18/2023   History of Present Illness Charlene Anderson is a 82 y.o. female with medical history significant of paroxysmal atrial fibrillation on Eliquis, solitary kidney, psoriasis, sent ED from [facility] with concern for A-fib with RVR and pneumonia.   Clinical Impression   [name] was seen for OT evaluation this date. Prior to hospital admission, pt was active and independent. She endorses going to the gym 3 days/week, regularly taking walks in her neighborhood using a walking stick, and is independent for ADL/IADLs. She denies falls history in the last year. Pt lives with her son in a 1 level home with at least 2 steps to enter. Pt presents to acute OT demonstrating impaired ADL performance and functional mobility 2/2 generalized weakness, decreased cardiopulmonary status, and decreased activity tolerance (See OT problem list for additional functional deficits). Pt currently requires SUPERVISION for functional mobility and decreased activity tolerance.  Pt would benefit from skilled OT services to address noted impairments and functional limitations (see below for any additional details) in order to maximize safety and independence while minimizing falls risk and caregiver burden. Do not anticipate the need for follow up OT services upon acute hospital DC.       Recommendations for follow up therapy are one component of a multi-disciplinary discharge planning process, led by the attending physician.  Recommendations may be updated based on patient status, additional functional criteria and insurance authorization.   Assistance Recommended at Discharge Intermittent Supervision/Assistance  Patient can return home with the following A little help with walking and/or transfers;A little help with bathing/dressing/bathroom;Help with stairs or ramp for entrance;Assist  for transportation;Assistance with cooking/housework    Functional Status Assessment  Patient has had a recent decline in their functional status and demonstrates the ability to make significant improvements in function in a reasonable and predictable amount of time.  Equipment Recommendations  BSC/3in1    Recommendations for Other Services       Precautions / Restrictions Precautions Precautions: Fall Precaution Comments: Monitor HR Restrictions Weight Bearing Restrictions: No      Mobility Bed Mobility Overal bed mobility: Modified Independent             General bed mobility comments: HOB elevated    Transfers Overall transfer level: Needs assistance Equipment used: 1 person hand held assist, None Transfers: Sit to/from Stand, Bed to chair/wheelchair/BSC Sit to Stand: Supervision Stand pivot transfers: Supervision                Balance Overall balance assessment: Needs assistance Sitting-balance support: Feet supported, No upper extremity supported Sitting balance-Leahy Scale: Good     Standing balance support: Reliant on assistive device for balance, During functional activity Standing balance-Leahy Scale: Good                             ADL either performed or assessed with clinical judgement   ADL Overall ADL's : Needs assistance/impaired                                       General ADL Comments: Pt functionally limited by cardiopulmonary status, decreased activity tolerance, and impaired balance. She requires close supervision for safety with STS t/f and SPT to [facility]. Pt significantly fatigued after minimal activity. Requires therapeutic rest breaks  t/o.     Vision Baseline Vision/History: 1 Wears glasses Patient Visual Report: No change from baseline       Perception     Praxis      Pertinent Vitals/Pain Pain Assessment Pain Assessment: No/denies pain     Hand Dominance Right   Extremity/Trunk  Assessment Upper Extremity Assessment Upper Extremity Assessment: Generalized weakness   Lower Extremity Assessment Lower Extremity Assessment: Generalized weakness   Cervical / Trunk Assessment Cervical / Trunk Assessment: Normal   Communication Communication Communication: No difficulties   Cognition Arousal/Alertness: Awake/alert Behavior During Therapy: WFL for tasks assessed/performed Overall Cognitive Status: Within Functional Limits for tasks assessed                                       General Comments  Pt HR noted to reach 140's with minimal activity (tras[facility]fer to/from [facility]) RN notified/aware. spO2 remains WFL ( >/= 92%) with pt on 2L [place] t/o session.    Exercises Other Exercises Other Exercises: Pt educated on role of OT in acute setting, safety, falls prevention, and energy conservation strategies including activity pacing and pursed lipped breathing.   Shoulder Instructions      Home Living Family/patient expects to be discharged to:: Private residence Living Arrangements: Children Available Help at Discharge: Family;Available 24 hours/day Type of Home: House Home Access: Stairs to enter [employer] of Steps: 2 in back 4 in the front Entrance Stairs-Rails: Right;Left;Can reach both Home Layout: One level     Bathroom Shower/Tub: Walk-in shower         Home Equipment: Hand held shower head   Additional Comments: walking stick      Prior Functioning/Environment Prior Level of Function : Independent/Modified Independent;Driving             Mobility Comments: Independent uses walking stick when she walk around her neighborhood ADLs Comments: Charlene Anderson, Goes to the gym 3 days/week, gardens        OT Problem List: Decreased strength;Decreased coordination;Decreased activity tolerance;Decreased safety awareness;Cardiopulmonary status limiting activity;Impaired balance (sitting and/or standing);Decreased knowledge of use of DME  or AE;Impaired UE functional use      OT Treatment/Interventions: Self-care/ADL training;Therapeutic exercise;Therapeutic activities;DME and/or AE instruction;Patient/family education;Balance training;Energy conservation    OT Goals(Current goals can be found in the care plan section) Acute Rehab OT Goals Patient Stated Goal: To go home OT Goal Formulation: With patient Time For Goal Achievement: 02/01/23 Potential to Achieve Goals: Good ADL Goals Pt Will Perform Grooming: sitting;with modified independence Pt Will Perform Lower Body Dressing: with adaptive equipment;with modified independence;sit to/from stand Pt Will Transfer to Toilet: bedside commode;with modified independence;ambulating Pt Will Perform Toileting - Clothing Manipulation and hygiene: sit to/from stand;with modified independence  OT Frequency: Min 2X/week    Co-evaluation              AM-PAC OT "6 Clicks" Daily Activity     Outcome Measure Help from another person eating meals?: None Help from another person taking care of personal grooming?: A Little Help from another person toileting, which includes using toliet, bedpan, or urinal?: A Little Help from another person bathing (including washing, rinsing, drying)?: A Little Help from another person to put on and taking off regular upper body clothing?: A Little Help from another person to put on and taking off regular lower body clothing?: A Little 6 Click Score: 19   End of Session Equipment  Utilized During Treatment: Oxygen Nurse Communication: Mobility status;Other (comment) (Vitals during session)  Activity Tolerance: Patient tolerated treatment well Patient left: in bed;with call bell/phone within reach;with bed alarm set  OT Visit Diagnosis: Other abnormalities of gait and mobility (R26.89);Muscle weakness (generalized) (M62.81)                Time: 9528-4132 OT Time Calculation (min): 18 min Charges:  OT General Charges $OT Visit: 1 Visit OT  Evaluation $OT Eval Moderate Complexity: 1 Mod OT Treatments $Self Care/Home Management : 8-22 mins  Rockney Ghee, M.S., OTR/L 01/18/23, 1:17 PM

## 2023-01-19 DIAGNOSIS — I4891 Unspecified atrial fibrillation: Secondary | ICD-10-CM | POA: Diagnosis not present

## 2023-01-19 LAB — CBC
HCT: 38.2 % (ref 36.0–46.0)
Hemoglobin: 12.4 g/dL (ref 12.0–15.0)
MCH: 31.5 pg (ref 26.0–34.0)
MCHC: 32.5 g/dL (ref 30.0–36.0)
MCV: 97 fL (ref 80.0–100.0)
Platelets: 360 10*3/uL (ref 150–400)
RBC: 3.94 MIL/uL (ref 3.87–5.11)
RDW: 13.3 % (ref 11.5–15.5)
WBC: 9.1 10*3/uL (ref 4.0–10.5)
nRBC: 0 % (ref 0.0–0.2)

## 2023-01-19 LAB — BASIC METABOLIC PANEL
Anion gap: 11 (ref 5–15)
BUN: 14 mg/dL (ref 8–23)
CO2: 20 mmol/L — ABNORMAL LOW (ref 22–32)
Calcium: 8.3 mg/dL — ABNORMAL LOW (ref 8.9–10.3)
Chloride: 106 mmol/L (ref 98–111)
Creatinine, Ser: 0.92 mg/dL (ref 0.44–1.00)
GFR, Estimated: >60 mL/min (ref >60)
Glucose, Bld: 120 mg/dL — ABNORMAL HIGH (ref 70–99)
Potassium: 3.2 mmol/L — ABNORMAL LOW (ref 3.5–5.1)
Sodium: 137 mmol/L (ref 135–145)

## 2023-01-19 LAB — URINE CULTURE: Culture: NO GROWTH

## 2023-01-19 LAB — CULTURE, BLOOD (ROUTINE X 2)

## 2023-01-19 LAB — ECHOCARDIOGRAM COMPLETE
S' Lateral: 2.2 cm
Weight: 1840 oz

## 2023-01-19 MED ORDER — ADULT MULTIVITAMIN W/MINERALS CH
1.0000 | ORAL_TABLET | Freq: Every day | ORAL | Status: DC
  Administered 2023-01-19 – 2023-01-21 (×3): 1 via ORAL
  Filled 2023-01-19 (×3): qty 1

## 2023-01-19 MED ORDER — POTASSIUM CHLORIDE CRYS ER 20 MEQ PO TBCR
40.0000 meq | EXTENDED_RELEASE_TABLET | ORAL | Status: AC
  Administered 2023-01-19 (×2): 40 meq via ORAL
  Filled 2023-01-19 (×2): qty 2

## 2023-01-19 MED ORDER — VITAMIN D 25 MCG (1000 UNIT) PO TABS
1000.0000 [IU] | ORAL_TABLET | Freq: Every day | ORAL | Status: DC
  Administered 2023-01-19 – 2023-01-21 (×3): 1000 [IU] via ORAL
  Filled 2023-01-19 (×3): qty 1

## 2023-01-19 NOTE — Progress Notes (Signed)
Progress Note   Patient: Charlene Anderson JXB:[id number] DOB: [birth year] DOA: 01/17/2023     2 DOS: the patient was seen and examined on 01/19/2023   Brief hospital course:  Charlene Anderson is a 82 y.o. female with medical history significant of paroxysmal atrial fibrillation on Eliquis, solitary kidney, psoriasis, sent ED from [facility] with concern for A-fib with RVR and pneumonia. Pt reports slight cough for the past couple of days.  No fever, rhinorrhea, congestion, nausea, vomiting, chest pain or diarrhea. Feels somewhat short of breath when laying flat. She has been taking antibiotics (cefdinir) prescribed for a UTI on 01/14/23. Denies burning with urination but continues to have pain near her right kidney.    Patient ED initials vitals showed she was afebrile, tachycardic (HR 142) and tachypneic (RR 23) normotensive and satting well on room air.   ED workup included COVID, influenza and RSV negative.  Lactic acid 2.7.  CBC showing leukocytosis with left shift (WBC 12.5).  Blood cultures collected.  Urinalysis, respiratory panel pending.  Urgent care x-ray showing air space opacities throughout the right lower lobe concerning for pneumonia which was thought to be worsened on portal CXR. She was started on ceftriaxone and azithromycin and given a 1L bolus of NS. ED physician consulted hosptialist for evaluation for admission.    Assessment and Plan: Sepsis secondary to community-acquired pneumonia Acute respiratory failure As evidenced by fever with a Tmax of 102.2, marked leukocytosis, lactic acidosis and chest x-ray findings concerning for right middle and lower lobe pneumonia. With acute respiratory failure (room air pulse oximetry of 88%) Leukocytosis shows a downward trend Will attempt to wean off oxygen as tolerated Continue antibiotic therapy with Rocephin and Zithromax Oxygen supplementation to maintain pulse oximetry greater than 92%     Atrial Fibrillation with  Rapid Ventricular Rate  Most likely secondary to acute infection Has been weaned off Cardizem drip and is currently on p.o. 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  - Avoid nephrotoxic agent and dehydration  - Trend serum creatine        Hypokalemia Supplement potassium Check magnesium levels                Subjective: Patient is seen and examined at the bedside.  She is sitting up in bed and has no new complaints.  Physical Exam: Vitals:   01/18/23 2204 01/19/23 0807 01/19/23 0926 01/19/23 1022  BP: (!) 99/58 98/65 (!) 109/59 112/68  Pulse: 99 (!) 102 (!) 110   Resp: 18 16 18    Temp: 98.5 F (36.9 C) 97.6 F (36.4 C)    TempSrc:      SpO2: 98% 94% 99%   Weight:      Height:       GEN:     alert, non-toxic appearing elderly female and no distress     HENT:  mucus membranes moist, oropharyngeal without lesions or erythema,  nares patent, no nasal discharge EYES:   pupils equal and reactive, EOM intact NECK:  supple, good ROM, no lymphadenopathy  RESP:  Tachypenic, right mid to basilar rales, no retraction, speaking full sentences without pause  CVS:    Irregularly irregular, ABD:    soft, non-tender; bowel sounds present; no palpable masses, EXT:     normal ROM, atraumatic, no LE edema  NEURO:  normal without  focal findings,  speech normal, alert and oriented at her baseline Skin:    warm and dry Psych: Normal affect, appropriate speech and behavior  Data Reviewed: Labs reviewed.  Mild hypokalemia.  White count is normalized. There are no new results to review at this time.  Family Communication: Plan of care discussed with patient at the bedside.  Disposition: Status is: Inpatient Remains inpatient appropriate because: IV antibiotic therapy  Planned Discharge Destination: Home    Time spent: 33 minutes  Author: Lucile Shutters,  MD 01/19/2023 1:45 PM  For on call review [email or web address].

## 2023-01-19 NOTE — Progress Notes (Signed)
Occupational Therapy Treatment Patient Details Name: Charlene Anderson MRN: [id number] DOB: 03/13/1941 Today's Date: 01/19/2023   History of present illness Whittney Marett is a 82 y.o. female with medical history significant of paroxysmal atrial fibrillation on Eliquis, solitary kidney, psoriasis, sent ED from [facility] with concern for A-fib with RVR and pneumonia.   OT comments  Pt seen for OT tx. Pt received in the recliner, agreeable to session, and denies SOB. SpO2 high 90's on 2L O2, HR 98 at rest. Pt instructed in incentive spirometer use after pt demo'd improper technique. Required additional instruction with repeat return demo to improve technique to support recall and carryover. Pt also instructed in activity pacing and PLB to promote breath recovery with exertional activity. Pt demonstrating understanding but would benefit from further instruction to optimize recall and carryover. Progressing towards goals.    Recommendations for follow up therapy are one component of a multi-disciplinary discharge planning process, led by the attending physician.  Recommendations may be updated based on patient status, additional functional criteria and insurance authorization.    Assistance Recommended at Discharge Intermittent Supervision/Assistance  Patient can return home with the following  A little help with walking and/or transfers;A little help with bathing/dressing/bathroom;Help with stairs or ramp for entrance;Assist for transportation;Assistance with cooking/housework   Equipment Recommendations  BSC/3in1    Recommendations for Other Services      Precautions / Restrictions Precautions Precautions: Fall (Simultaneous filing. User may not have seen previous data.) Precaution Comments: Difficult to get pleth for O2 sats/HR (Simultaneous filing. User may not have seen previous data.) Restrictions Weight Bearing Restrictions: No (Simultaneous filing. User may not have seen  previous data.)       Mobility Bed Mobility               General bed mobility comments: NT in recliner    Transfers                         Balance                                           ADL either performed or assessed with clinical judgement   ADL                                              Extremity/Trunk Assessment              Vision       Perception     Praxis      Cognition Arousal/Alertness: Awake/alert Behavior During Therapy: WFL for tasks assessed/performed Overall Cognitive Status: Within Functional Limits for tasks assessed                                          Exercises Other Exercises Other Exercises: Pt instructed in incentive spirometer use after pt demo'd improper technique. Required additional instruction with repeat return demo to improve technique to support recall and carryover. Pt also instructed in activity pacing and PLB to promote breath recovery with exertional activity.    Shoulder Instructions       General Comments  Pertinent Vitals/ Pain       Pain Assessment Pain Assessment: No/denies pain  Home Living                                          Prior Functioning/Environment              Frequency  Min 2X/week        Progress Toward Goals  OT Goals(current goals can now be found in the care plan section)  Progress towards OT goals: Progressing toward goals  Acute Rehab OT Goals Patient Stated Goal: to go home OT Goal Formulation: With patient Time For Goal Achievement: 02/01/23 Potential to Achieve Goals: Good  Plan Discharge plan remains appropriate;Frequency remains appropriate    Co-evaluation                 AM-PAC OT "6 Clicks" Daily Activity     Outcome Measure   Help from another person eating meals?: None Help from another person taking care of personal grooming?: A Little Help from  another person toileting, which includes using toliet, bedpan, or urinal?: A Little Help from another person bathing (including washing, rinsing, drying)?: A Little Help from another person to put on and taking off regular upper body clothing?: A Little Help from another person to put on and taking off regular lower body clothing?: A Little 6 Click Score: 19    End of Session Equipment Utilized During Treatment: Oxygen  OT Visit Diagnosis: Other abnormalities of gait and mobility (R26.89);Muscle weakness (generalized) (M62.81)   Activity Tolerance Patient tolerated treatment well   Patient Left in chair;with call bell/phone within reach;with chair alarm set   Nurse Communication          Time: [phone] OT Time Calculation (min): 10 min  Charges: OT General Charges $OT Visit: 1 Visit OT Treatments $Therapeutic Activity: 8-22 mins  Arman Filter., MPH, MS, OTR/L ascom [phone] 01/19/23, 1:57 PM

## 2023-01-19 NOTE — Plan of Care (Signed)
  Problem: Activity: Goal: Ability to tolerate increased activity will improve Outcome: Progressing   Problem: Respiratory: Goal: Ability to maintain adequate ventilation will improve Outcome: Progressing   Problem: Respiratory: Goal: Ability to maintain adequate ventilation will improve Outcome: Progressing   Problem: Education: Goal: Knowledge of General Education information will improve Description: Including pain rating scale, medication(s)/side effects and non-pharmacologic comfort measures Outcome: Progressing   Problem: Health Behavior/Discharge Planning: Goal: Ability to manage health-related needs will improve Outcome: Progressing

## 2023-01-19 NOTE — Progress Notes (Signed)
[facility] CLINIC CARDIOLOGY CONSULT NOTE       Patient ID: Charlene Anderson MRN: [id number] DOB/AGE: 84-Feb-1942 27 y.o.  Admit date: 01/17/2023 Referring Physician Dr.  Joylene Igo Primary Physician Dr. Angus Palms Primary Cardiologist previous Dr. Lady Gary Reason for Consultation AF RVR  HPI: Charlene Anderson is an 22yoF with a PMH paroxysmal AF (dose reduced Eliquis), congenital solitary kidney who presented to [facility] ED 01/17/2023 from an urgent care and [place] with concern for tachycardia and community-acquired pneumonia.  Cardiology is consulted for assistance with her AF.  Interval history: -Continues to feel better today, eating lunch -Not presently on telemetry, HR by pulse ox 110s -Still coughing with a small supplemental O2 requirement -Denies chest pain, heart racing or palpitations, or worsening peripheral edema above baseline -Echo resulted with preserved LVEF 50-55% with mild to moderate LA dilation, moderate MR, moderate TR without additional valvular abnormality or regional wall motion abnormalities.  Review of systems complete and found to be negative unless listed above     Past Medical History:  Diagnosis Date   Cancer (HCC)    skin ca   Cardiac murmur    Congenital absence of one kidney    Paroxysmal atrial fibrillation (HCC)    Varicose veins of lower extremity     Past Surgical History:  Procedure Laterality Date   ABDOMINAL HYSTERECTOMY      Medications Prior to Admission  Medication Sig Dispense Refill Last Dose   Cholecalciferol (VITAMIN D-1000 MAX ST) 25 MCG (1000 UT) tablet Take by mouth.   01/16/2023   ELIQUIS 5 MG TABS tablet SMARTSIG:1 Tablet(s) By Mouth Every 12 Hours   01/16/2023   Social History   Socioeconomic History   Marital status: Widowed    Spouse name: Not on file   Number of children: Not on file   Years of education: Not on file   Highest education level: Not on file  Occupational History   Not on file  Tobacco Use   Smoking status:  Never   Smokeless tobacco: Never  Vaping Use   Vaping Use: Never used  Substance and Sexual Activity   Alcohol use: Never   Drug use: Never   Sexual activity: Not Currently  Other Topics Concern   Not on file  Social History Narrative   Not on file   Social Determinants of Health   Financial Resource Strain: Not on file  Food Insecurity: No Food Insecurity (01/18/2023)   Hunger Vital Sign    Worried About Running Out of Food in the Last Year: Never true    Ran Out of Food in the Last Year: Never true  Transportation Needs: No Transportation Needs (01/18/2023)   PRAPARE - Administrator, Civil Service (Medical): No    Lack of Transportation (Non-Medical): No  Physical Activity: Not on file  Stress: Not on file  Social Connections: Not on file  Intimate Partner Violence: Not At Risk (01/18/2023)   Humiliation, Afraid, Rape, and Kick questionnaire    Fear of Current or Ex-Partner: No    Emotionally Abused: No    Physically Abused: No    Sexually Abused: No    Family History  Problem Relation Age of Onset   Breast cancer Mother 72   Breast cancer Maternal Aunt 46   Breast cancer Cousin 50       mat cousin      Intake/Output Summary (Last 24 hours) at 01/19/2023 1518 Last data filed at 01/18/2023 1915 Gross per 24  hour  Intake 120 ml  Output --  Net 120 ml    Vitals:   01/19/23 0926 01/19/23 1022 01/19/23 1352 01/19/23 1510  BP: (!) 109/59 112/68  93/66  Pulse: (!) 110   96  Resp: 18     Temp:    98 F (36.7 C)  TempSrc:      SpO2: 99%  91% 97%  Weight:      Height:        PHYSICAL EXAM General: Pleasant ill-appearing elderly female, well nourished, in no acute distress.  Sitting upright in recliner eating lunch, 2 children at bedside HEENT:  Normocephalic and atraumatic. Neck:  No JVD.  Lungs: Normal respiratory effort on O2 by Daisy.  Rhonchi on the right without appreciable crackles or wheezes.  Dry cough with deep inspiration. Heart: Irregularly  irregular with controlled rate. Normal S1 and S2 without gallops or murmurs.  Abdomen: Non-distended appearing.  Msk: Normal strength and tone for age. Extremities: Warm and well perfused. No clubbing, cyanosis.  Trace bilateral lower extremity edema at baseline per patient.  Neuro: Alert and oriented X 3. Psych:  Answers questions appropriately.   Labs: Basic Metabolic Panel: Recent Labs    01/18/23 0614 01/19/23 0854  NA 137 137  K 3.1* 3.2*  CL 107 106  CO2 20* 20*  GLUCOSE 124* 120*  BUN 18 14  CREATININE 0.87 0.92  CALCIUM 7.8* 8.3*  MG 2.0  --    Liver Function Tests: Recent Labs    01/17/23 1417 01/18/23 0614  AST 37 34  ALT 18 20  ALKPHOS 53 51  BILITOT 1.0 0.8  PROT 6.9 5.7*  ALBUMIN 2.9* 2.4*   No results for input(s): "LIPASE", "AMYLASE" in the last 72 hours. CBC: Recent Labs    01/17/23 1417 01/18/23 0614 01/19/23 0854  WBC 12.5* 9.7 9.1  NEUTROABS 9.6*  --   --   HGB 13.0 12.0 12.4  HCT 39.9 36.0 38.2  MCV 96.4 94.7 97.0  PLT 329 304 360   Cardiac Enzymes: No results for input(s): "CKTOTAL", "CKMB", "CKMBINDEX", "TROPONINIHS" in the last 72 hours. BNP: No results for input(s): "BNP" in the last 72 hours. D-Dimer: No results for input(s): "DDIMER" in the last 72 hours. Hemoglobin A1C: No results for input(s): "HGBA1C" in the last 72 hours. Fasting Lipid Panel: No results for input(s): "CHOL", "HDL", "LDLCALC", "TRIG", "CHOLHDL", "LDLDIRECT" in the last 72 hours. Thyroid Function Tests: No results for input(s): "TSH", "T4TOTAL", "T3FREE", "THYROIDAB" in the last 72 hours.  Invalid input(s): "FREET3" Anemia Panel: No results for input(s): "VITAMINB12", "FOLATE", "FERRITIN", "TIBC", "IRON", "RETICCTPCT" in the last 72 hours.   Radiology: ECHOCARDIOGRAM COMPLETE  Result Date: 01/19/2023    ECHOCARDIOGRAM REPORT   Patient Name:   Charlene Anderson Date of Exam: 01/18/2023 Medical Rec #:  [id number]            Height:       62.0 in Accession #:     [id number]           Weight:       115.0 lb Date of Birth:  08-11-1941            BSA:          1.511 m Patient Age:    82 years             BP:           113/68 mmHg Patient Gender: F  HR:           115 bpm. Exam Location:  [facility] Procedure: 2D Echo, Cardiac Doppler and Color Doppler Indications:     I48.91 Atrial Fibrillation  History:         Patient has no prior history of Echocardiogram examinations.  Sonographer:     Daphine Deutscher RDCS Referring Phys:  [id number] Cheryln Manly Kaydince Towles Diagnosing Phys: Marcina Millard MD IMPRESSIONS  1. Left ventricular ejection fraction, by estimation, is 50 to 55%. The left ventricle has low normal function. The left ventricle has no regional wall motion abnormalities. Indeterminate diastolic filling due to E-A fusion.  2. Right ventricular systolic function is normal. The right ventricular size is mildly enlarged.  3. Left atrial size was mild to moderately dilated.  4. The mitral valve is normal in structure. Moderate mitral valve regurgitation. No evidence of mitral stenosis.  5. Tricuspid valve regurgitation is moderate.  6. The aortic valve is normal in structure. Aortic valve regurgitation is not visualized. No aortic stenosis is present.  7. The inferior vena cava is normal in size with greater than 50% respiratory variability, suggesting right atrial pressure of 3 mmHg. FINDINGS  Left Ventricle: Left ventricular ejection fraction, by estimation, is 50 to 55%. The left ventricle has low normal function. The left ventricle has no regional wall motion abnormalities. The left ventricular internal cavity size was normal in size. There is no left ventricular hypertrophy. Indeterminate diastolic filling due to E-A fusion. Right Ventricle: The right ventricular size is mildly enlarged. No increase in right ventricular wall thickness. Right ventricular systolic function is normal. Left Atrium: Left atrial size was mild to moderately dilated. Right  Atrium: Right atrial size was normal in size. Pericardium: There is no evidence of pericardial effusion. Mitral Valve: The mitral valve is normal in structure. Moderate mitral valve regurgitation. No evidence of mitral valve stenosis. Tricuspid Valve: The tricuspid valve is normal in structure. Tricuspid valve regurgitation is moderate . No evidence of tricuspid stenosis. Aortic Valve: The aortic valve is normal in structure. Aortic valve regurgitation is not visualized. No aortic stenosis is present. Pulmonic Valve: The pulmonic valve was normal in structure. Pulmonic valve regurgitation is not visualized. No evidence of pulmonic stenosis. Aorta: The aortic root is normal in size and structure. Venous: The inferior vena cava is normal in size with greater than 50% respiratory variability, suggesting right atrial pressure of 3 mmHg. IAS/Shunts: No atrial level shunt detected by color flow Doppler.  LEFT VENTRICLE PLAX 2D LVIDd:         3.00 cm LVIDs:         2.20 cm LV PW:         0.70 cm LV IVS:        0.70 cm LVOT diam:     1.80 cm LV SV:         27 LV SV Index:   18 LVOT Area:     2.54 cm  RIGHT VENTRICLE             IVC RV Basal diam:  3.00 cm     IVC diam: 1.90 cm RV S prime:     13.82 cm/s TAPSE (M-mode): 1.9 cm LEFT ATRIUM             Index        RIGHT ATRIUM           Index LA diam:        4.20 cm 2.78 cm/m  RA Area:     12.40 cm LA Vol (A2C):   42.8 ml 28.32 ml/m  RA Volume:   30.70 ml  20.32 ml/m LA Vol (A4C):   53.5 ml 35.41 ml/m LA Biplane Vol: 48.0 ml 31.77 ml/m  AORTIC VALVE LVOT Vmax:   80.22 cm/s LVOT Vmean:  55.075 cm/s LVOT VTI:    0.108 m  AORTA Ao Root diam: 2.80 cm MV E velocity: 104.25 cm/s  TRICUSPID VALVE                             TR Peak grad:   19.0 mmHg                             TR Vmax:        218.00 cm/s                              SHUNTS                             Systemic VTI:  0.11 m                             Systemic Diam: 1.80 cm Marcina Millard MD  Electronically signed by Marcina Millard MD Signature Date/Time: 01/19/2023/1:25:14 PM    Final    DG Chest Port 1 View  Result Date: 01/17/2023 CLINICAL DATA:  Sepsis EXAM: PORTABLE CHEST 1 VIEW COMPARISON:  01/17/2023, 03/17/2020 FINDINGS: Slightly worse diffuse airspace opacity throughout the periphery of the right mid and lower lung compatible with pneumonia. Stable left lung aeration. Similar background hyperinflation. Negative for edema, effusion or pneumothorax. Normal heart size and vascularity. Trachea midline. Aorta atherosclerotic. No acute osseous finding. Degenerative changes of the shoulders. IMPRESSION: Slightly worse right mid and lower lung peripheral airspace opacity compatible with pneumonia. Electronically Signed   By: Judie Petit.  Shick M.D.   On: 01/17/2023 14:54   DG Chest 2 View  Result Date: 01/17/2023 CLINICAL DATA:  Cough, fatigue, nausea EXAM: CHEST - 2 VIEW COMPARISON:  03/17/2020 FINDINGS: The heart size and mediastinal contours are within normal limits. Heterogeneous airspace opacity throughout the peripheral right lower lobe. Pulmonary hyperinflation and emphysema. Nonacute wedge deformity of the midthoracic spine, approximately T7. IMPRESSION: 1. Heterogeneous airspace opacity throughout the peripheral right lower lobe, concerning for infection. Recommend follow-up PA and lateral radiographs in 6-8 weeks to ensure resolution and exclude underlying malignancy. 2.  Pulmonary hyperinflation and emphysema. Electronically Signed   By: Jearld Lesch M.D.   On: 01/17/2023 13:18    ECHO preserved LVEF 50-55% with mild to moderate LA dilation, moderate MR, moderate TR without additional valvular abnormality or regional wall motion abnormalities.  TELEMETRY reviewed by me (LT) 01/19/2023 : Not available at my time of evaluation.    EKG reviewed by me: AF 99BPM   Data reviewed by me (LT) 01/19/2023: Hospitalist progress note, PT note last 24h vitals tele labs imaging I/O   Principal  Problem:   Atrial fibrillation with rapid ventricular response (HCC) Active Problems:   Sepsis (HCC)   Solitary kidney, congenital   CAP (community acquired pneumonia)    ASSESSMENT AND PLAN:  Charlene Anderson is an 47yoF with a PMH paroxysmal AF (dose reduced Eliquis), congenital solitary  kidney who presented to [facility] ED 01/17/2023 from an urgent care and [place] with concern for tachycardia and community-acquired pneumonia.  Cardiology is consulted for assistance with her AF.  # Community-acquired pneumonia Presents with several days of coughing, chills, leukocytosis to 12 K, and febrile to 102.2 degrees F first night of admission, with CXR c/f RML and RLL opacity and a 2L oxygen requirement with none at baseline.  Clinical improvement overall today with resolving leukocytosis and afebrile overnight. -On azithromycin and ceftriaxone per primary -Wean oxygen as tolerated  # Paroxysmal AF RVR # Hypokalemia Initially in RVR with rates in the 130s.  Suspect provoked in the setting of fever and CAP above, rate controlled after diltiazem infusion and replenishing electrolytes. -Continue to treat causes of increased adrenergic tone including fever, infection, pain, electrolyte disturbances, etc. -Continuous telemetry monitoring while hospitalized -S/p diltiazem infusion  - continue diltiazem 30 mg every 6 hours with goals to consolidate by discharge -Continue Eliquis 2.5 mg twice daily for stroke prevention (age >53, weight <60 Kgs) -Monitor and replenish electrolytes for goal K >4, mag >2 -Echo complete preserved LVEF, moderate MR and TR  This patient's plan of care was discussed and created with Dr. Darrold Junker and he is in agreement.  Signed: Rebeca Allegra , PA-C 01/19/2023, 3:18 PM [facility] Cardiology

## 2023-01-19 NOTE — Progress Notes (Signed)
Physical Therapy Treatment Patient Details Name: Charlene Anderson MRN: [id number] DOB: 25-Apr-1941 Today's Date: 01/19/2023   History of Present Illness Charlene Anderson is a 82 y.o. female with medical history significant of paroxysmal atrial fibrillation on Eliquis, solitary kidney, psoriasis, sent ED from [facility] with concern for A-fib with RVR and pneumonia.    PT Comments    Patient received in recliner, she is agreeable to PT session. Patient reports she is feeling better, son and daughter at bedside. Patient is able to stand from recliner independently. She ambulated 150 feet with light single UE support on furniture, rails in hallway. 1 mild lob. Patient ambulated on room air with sats 91-93%. She will continue to benefit from skilled PT to improve functional independence, strength and safety.       Recommendations for follow up therapy are one component of a multi-disciplinary discharge planning process, led by the attending physician.  Recommendations may be updated based on patient status, additional functional criteria and insurance authorization.  Follow Up Recommendations       Assistance Recommended at Discharge Intermittent Supervision/Assistance  Patient can return home with the following A little help with walking and/or transfers;A little help with bathing/dressing/bathroom;Assist for transportation;Assistance with cooking/housework;Help with stairs or ramp for entrance   Equipment Recommendations  Cane    Recommendations for Other Services       Precautions / Restrictions Precautions Precautions: Fall (Simultaneous filing. User may not have seen previous data.) Precaution Comments: Difficult to get pleth for O2 sats/HR (Simultaneous filing. User may not have seen previous data.) Restrictions Weight Bearing Restrictions: No (Simultaneous filing. User may not have seen previous data.)     Mobility  Bed Mobility               General bed  mobility comments: NT in recliner    Transfers Overall transfer level: Independent   Transfers: Sit to/from Stand Sit to Stand: Independent                Ambulation/Gait Ambulation/Gait assistance: Min guard Gait Distance (Feet): 150 Feet Assistive device: None Gait Pattern/deviations: Step-through pattern, Drifts right/left Gait velocity: decr     General Gait Details: Patient continues to feel weaker than her usual, no sob reported with ambulation. She would benefit from at least cane for improved balance at this time. Had one slight lob.   Stairs             Wheelchair Mobility    Modified Rankin (Stroke Patients Only)       Balance Overall balance assessment: Needs assistance Sitting-balance support: Feet supported Sitting balance-Leahy Scale: Normal     Standing balance support: Single extremity supported, During functional activity, Reliant on assistive device for balance Standing balance-Leahy Scale: Good Standing balance comment: good static standing, uses 1 UE for dynamic mobility, rails, counters, etc                            Cognition                                                Exercises      General Comments        Pertinent Vitals/Pain      Home Living  Prior Function            PT Goals (current goals can now be found in the care plan section) Acute Rehab PT Goals Patient Stated Goal: return home PT Goal Formulation: With patient/family Time For Goal Achievement: 02/01/23 Potential to Achieve Goals: Good Progress towards PT goals: Progressing toward goals    Frequency    Min 3X/week      PT Plan Current plan remains appropriate    Co-evaluation              AM-PAC PT "6 Clicks" Mobility   Outcome Measure  Help needed turning from your back to your side while in a flat bed without using bedrails?: None Help needed moving from lying  on your back to sitting on the side of a flat bed without using bedrails?: None Help needed moving to and from a bed to a chair (including a wheelchair)?: A Little Help needed standing up from a chair using your arms (e.g., wheelchair or bedside chair)?: A Little Help needed to walk in hospital room?: A Little Help needed climbing 3-5 steps with a railing? : A Little 6 Click Score: 20    End of Session   Activity Tolerance: Patient tolerated treatment well Patient left: in chair;with call bell/phone within reach;with family/visitor present Nurse Communication: Mobility status PT Visit Diagnosis: Unsteadiness on feet (R26.81);Other abnormalities of gait and mobility (R26.89);Muscle weakness (generalized) (M62.81);Difficulty in walking, not elsewhere classified (R26.2)     Time: 1610-9604 PT Time Calculation (min) (ACUTE ONLY): 10 min  Charges:  $Gait Training: 8-22 mins                     Kamoria Lucien, PT, GCS 01/19/23,2:00 PM

## 2023-01-19 NOTE — Plan of Care (Signed)

## 2023-01-19 NOTE — Progress Notes (Signed)
Initial Nutrition Assessment  DOCUMENTATION CODES:   Not applicable  INTERVENTION:  Continue regular diet Encourage PO intake MVI with minerals daily Continue Ensure Enlive po TID, each supplement provides 350 kcal and 20 grams of protein. Add 1000 IU of vitamin D daily per home medication list  NUTRITION DIAGNOSIS:   Increased nutrient needs related to acute illness (sepsis) as evidenced by estimated needs.  GOAL:   Patient will meet greater than or equal to 90% of their needs  MONITOR:   PO intake, Supplement acceptance, Labs, Weight trends  REASON FOR ASSESSMENT:   Consult Assessment of nutrition requirement/status  ASSESSMENT:   Pt with hx of atrial fibrillation and congenital solitary kidney presented to ED from urgent care with SOB and tachycardia. Found to have pneumonia.   Attempted to call pt on room phone, no answer at this time.   Reviewed chart and pt with 1 meal completion documented at 100%. Reviewed dining software and meals are adequate to support nutrition needs if consumed at 100%.    Minor weight loss of ~2% noted in the last month which is not severe but concerning due to pt's advanced age and undesirable geriatric BMI (<23). Ensure already in place TID from MST screen, will continue and add MVI daily as well as 1000 IU of vitamin D per pt's home med list.   Average Meal Intake: 6/5: 100% intake x 1 recorded meals  Nutritionally Relevant Medications: Scheduled Meds:  Ensure Enlive  237 mL Oral TID BM   pantoprazole  40 mg Oral Daily   Continuous Infusions:  azithromycin 500 mg (01/18/23 1614)   cefTRIAXone (ROCEPHIN)  IV 2 g (01/18/23 1603)   PRN Meds: ondansetron, polyethylene glycol  Labs Reviewed: K 3.2  NUTRITION - FOCUSED PHYSICAL EXAM: Defer to in-person assessment  Diet Order:   Diet Order             Diet regular Room service appropriate? Yes; Fluid consistency: Thin  Diet effective now                   EDUCATION  NEEDS:   No education needs have been identified at this time  Skin:  Skin Assessment: Reviewed RN Assessment  Last BM:  6/5 - type 2  Height:   Ht Readings from Last 1 Encounters:  01/17/23 5\' 2"  (1.575 m)    Weight:   Wt Readings from Last 1 Encounters:  01/17/23 52.2 kg    Ideal Body Weight:  50 kg  BMI:  Body mass index is 21.03 kg/m.  Estimated Nutritional Needs:  Kcal:  1300-1500 kcal/d Protein:  60-80g/d Fluid:  1.5L/d    Greig Castilla, RD, LDN Clinical Dietitian RD pager # available in [facility]  After hours/weekend pager # available in [facility]

## 2023-01-20 DIAGNOSIS — I4891 Unspecified atrial fibrillation: Secondary | ICD-10-CM | POA: Diagnosis not present

## 2023-01-20 LAB — LEGIONELLA PNEUMOPHILA SEROGP 1 UR AG: L. pneumophila Serogp 1 Ur Ag: NEGATIVE

## 2023-01-20 LAB — CBC
HCT: 35.9 % — ABNORMAL LOW (ref 36.0–46.0)
Hemoglobin: 11.7 g/dL — ABNORMAL LOW (ref 12.0–15.0)
MCH: 31.1 pg (ref 26.0–34.0)
MCHC: 32.6 g/dL (ref 30.0–36.0)
MCV: 95.5 fL (ref 80.0–100.0)
Platelets: 360 10*3/uL (ref 150–400)
RBC: 3.76 MIL/uL — ABNORMAL LOW (ref 3.87–5.11)
RDW: 13.3 % (ref 11.5–15.5)
WBC: 8.6 10*3/uL (ref 4.0–10.5)
nRBC: 0 % (ref 0.0–0.2)

## 2023-01-20 LAB — BASIC METABOLIC PANEL
Anion gap: 6 (ref 5–15)
BUN: 16 mg/dL (ref 8–23)
CO2: 22 mmol/L (ref 22–32)
Calcium: 8.4 mg/dL — ABNORMAL LOW (ref 8.9–10.3)
Chloride: 112 mmol/L — ABNORMAL HIGH (ref 98–111)
Creatinine, Ser: 0.79 mg/dL (ref 0.44–1.00)
GFR, Estimated: >60 mL/min (ref >60)
Glucose, Bld: 107 mg/dL — ABNORMAL HIGH (ref 70–99)
Potassium: 4.3 mmol/L (ref 3.5–5.1)
Sodium: 140 mmol/L (ref 135–145)

## 2023-01-20 MED ORDER — DILTIAZEM HCL ER COATED BEADS 180 MG PO CP24
180.0000 mg | ORAL_CAPSULE | Freq: Every day | ORAL | Status: DC
  Administered 2023-01-20 – 2023-01-21 (×2): 180 mg via ORAL
  Filled 2023-01-20 (×2): qty 1

## 2023-01-20 MED ORDER — DOXYCYCLINE HYCLATE 100 MG PO TABS
100.0000 mg | ORAL_TABLET | Freq: Two times a day (BID) | ORAL | Status: DC
  Administered 2023-01-20 – 2023-01-21 (×2): 100 mg via ORAL
  Filled 2023-01-20 (×2): qty 1

## 2023-01-20 MED ORDER — METOPROLOL TARTRATE 25 MG PO TABS
25.0000 mg | ORAL_TABLET | Freq: Two times a day (BID) | ORAL | Status: DC
  Administered 2023-01-20 – 2023-01-21 (×3): 25 mg via ORAL
  Filled 2023-01-20 (×3): qty 1

## 2023-01-20 MED ORDER — METOPROLOL TARTRATE 25 MG PO TABS: 25.0000 mg | ORAL_TABLET | Freq: Once | ORAL | Status: DC

## 2023-01-20 MED ORDER — GUAIFENESIN-DM 100-10 MG/5ML PO SYRP
5.0000 mL | ORAL_SOLUTION | ORAL | Status: DC | PRN
  Administered 2023-01-20 – 2023-01-21 (×3): 5 mL via ORAL
  Filled 2023-01-20 (×3): qty 10

## 2023-01-20 NOTE — TOC Progression Note (Signed)
Transition of Care [facility]) - Progression Note    Patient Details  Name: Charlene Anderson MRN: [id number] Date of Birth: [birth year]  Transition of Care [facility]) CM/SW Contact  Marlowe Sax, RN Phone Number: 01/20/2023, 3:17 PM  Clinical Narrative:     Spoke with the patient She stated that she lives at home with her son and has all she needs, she does not want HH and said she does not need it, She has a PCP Rayetta Humphrey, MD   Expected Discharge Plan: Home/Self Care Barriers to Discharge: Barriers Resolved  Expected Discharge Plan and Services   Discharge Planning Services: CM Consult   Living arrangements for the past 2 months: Single Family Home                 DME Arranged: N/A DME Agency: NA       HH Arranged: NA, Refused HH           Social Determinants of Health (SDOH) Interventions SDOH Screenings   Food Insecurity: No Food Insecurity (01/18/2023)  Housing: Low Risk  (01/18/2023)  Transportation Needs: No Transportation Needs (01/18/2023)  Utilities: Not At Risk (01/18/2023)  Tobacco Use: Low Risk  (01/18/2023)    Readmission Risk Interventions     No data to display

## 2023-01-20 NOTE — Progress Notes (Signed)
[facility] CLINIC CARDIOLOGY CONSULT NOTE       Patient ID: Charlene Anderson MRN: [id number] DOB/AGE: November 16, 1940 82 y.o.  Admit date: 01/17/2023 Referring Physician Dr.  Joylene Igo Primary Physician Dr. Angus Palms Primary Cardiologist previous Dr. Lady Gary Reason for Consultation AF RVR  HPI: Charlene Anderson is an 36yoF with a PMH paroxysmal AF (dose reduced Eliquis), congenital solitary kidney who presented to [facility] ED 01/17/2023 from an urgent care and [place] with concern for tachycardia and community-acquired pneumonia.  Cardiology is consulted for assistance with her AF.  Interval history: - continues to feel well and is eager to go home  -Still coughing, off supplemental O2. Desatted to 88% while nurses ambulated  -Denies chest pain, heart racing or palpitations, or worsening peripheral edema above baseline - remains in AF on tele, rate uncontrolled in the 130s-140s earlier AM. After addition of metoprolol tartrate 25mg  PO + consolidating cardizem to CD 180mg  - rate high 90s to low 100s  Review of systems complete and found to be negative unless listed above     Past Medical History:  Diagnosis Date   Cancer (HCC)    skin ca   Cardiac murmur    Congenital absence of one kidney    Paroxysmal atrial fibrillation (HCC)    Varicose veins of lower extremity     Past Surgical History:  Procedure Laterality Date   ABDOMINAL HYSTERECTOMY      Medications Prior to Admission  Medication Sig Dispense Refill Last Dose   Cholecalciferol (VITAMIN D-1000 MAX ST) 25 MCG (1000 UT) tablet Take by mouth.   01/16/2023   ELIQUIS 5 MG TABS tablet SMARTSIG:1 Tablet(s) By Mouth Every 12 Hours   01/16/2023   Social History   Socioeconomic History   Marital status: Widowed    Spouse name: Not on file   Number of children: Not on file   Years of education: Not on file   Highest education level: Not on file  Occupational History   Not on file  Tobacco Use   Smoking status: Never   Smokeless  tobacco: Never  Vaping Use   Vaping Use: Never used  Substance and Sexual Activity   Alcohol use: Never   Drug use: Never   Sexual activity: Not Currently  Other Topics Concern   Not on file  Social History Narrative   Not on file   Social Determinants of Health   Financial Resource Strain: Not on file  Food Insecurity: No Food Insecurity (01/18/2023)   Hunger Vital Sign    Worried About Running Out of Food in the Last Year: Never true    Ran Out of Food in the Last Year: Never true  Transportation Needs: No Transportation Needs (01/18/2023)   PRAPARE - Administrator, Civil Service (Medical): No    Lack of Transportation (Non-Medical): No  Physical Activity: Not on file  Stress: Not on file  Social Connections: Not on file  Intimate Partner Violence: Not At Risk (01/18/2023)   Humiliation, Afraid, Rape, and Kick questionnaire    Fear of Current or Ex-Partner: No    Emotionally Abused: No    Physically Abused: No    Sexually Abused: No    Family History  Problem Relation Age of Onset   Breast cancer Mother 86   Breast cancer Maternal Aunt 76   Breast cancer Cousin 50       mat cousin      Intake/Output Summary (Last 24 hours) at 01/20/2023 1537 Last  data filed at 01/20/2023 1424 Gross per 24 hour  Intake 480 ml  Output --  Net 480 ml     Vitals:   01/19/23 1510 01/19/23 1620 01/19/23 2301 01/20/23 0857  BP: 93/66 (!) 105/56 108/73 117/72  Pulse: 96 93 97 (!) 103  Resp:  (!) 24 18 18   Temp: 98 F (36.7 C) 97.8 F (36.6 C) 98.9 F (37.2 C) (!) 97.5 F (36.4 C)  TempSrc:  Oral Oral   SpO2: 97% 96% 94% 98%  Weight:      Height:        PHYSICAL EXAM General: Pleasant ill-appearing elderly female, well nourished, in no acute distress.  Sitting upright in recliner. HEENT:  Normocephalic and atraumatic. Neck:  No JVD.  Lungs: Normal respiratory effort on room air. Rhonchi on the right without appreciable crackles or wheezes.  Dry cough. Heart: tachy  irregularly irregular. Normal S1 and S2 without gallops or murmurs.  Abdomen: Non-distended appearing.  Msk: Normal strength and tone for age. Extremities: Warm and well perfused. No clubbing, cyanosis.  Trace bilateral lower extremity edema at baseline per patient.  Neuro: Alert and oriented X 3. Psych:  Answers questions appropriately.   Labs: Basic Metabolic Panel: Recent Labs    01/18/23 0614 01/19/23 0854 01/20/23 0549  NA 137 137 140  K 3.1* 3.2* 4.3  CL 107 106 112*  CO2 20* 20* 22  GLUCOSE 124* 120* 107*  BUN 18 14 16   CREATININE 0.87 0.92 0.79  CALCIUM 7.8* 8.3* 8.4*  MG 2.0  --   --     Liver Function Tests: Recent Labs    01/18/23 0614  AST 34  ALT 20  ALKPHOS 51  BILITOT 0.8  PROT 5.7*  ALBUMIN 2.4*    No results for input(s): "LIPASE", "AMYLASE" in the last 72 hours. CBC: Recent Labs    01/19/23 0854 01/20/23 0549  WBC 9.1 8.6  HGB 12.4 11.7*  HCT 38.2 35.9*  MCV 97.0 95.5  PLT 360 360    Cardiac Enzymes: No results for input(s): "CKTOTAL", "CKMB", "CKMBINDEX", "TROPONINIHS" in the last 72 hours. BNP: No results for input(s): "BNP" in the last 72 hours. D-Dimer: No results for input(s): "DDIMER" in the last 72 hours. Hemoglobin A1C: No results for input(s): "HGBA1C" in the last 72 hours. Fasting Lipid Panel: No results for input(s): "CHOL", "HDL", "LDLCALC", "TRIG", "CHOLHDL", "LDLDIRECT" in the last 72 hours. Thyroid Function Tests: No results for input(s): "TSH", "T4TOTAL", "T3FREE", "THYROIDAB" in the last 72 hours.  Invalid input(s): "FREET3" Anemia Panel: No results for input(s): "VITAMINB12", "FOLATE", "FERRITIN", "TIBC", "IRON", "RETICCTPCT" in the last 72 hours.   Radiology: ECHOCARDIOGRAM COMPLETE  Result Date: 01/19/2023    ECHOCARDIOGRAM REPORT   Patient Name:   Charlene Anderson Date of Exam: 01/18/2023 Medical Rec #:  [id number]            Height:       62.0 in Accession #:    [id number]           Weight:       115.0 lb  Date of Birth:  07/09/1941            BSA:          1.511 m Patient Age:    82 years             BP:           113/68 mmHg Patient Gender: F  HR:           115 bpm. Exam Location:  [facility] Procedure: 2D Echo, Cardiac Doppler and Color Doppler Indications:     I48.91 Atrial Fibrillation  History:         Patient has no prior history of Echocardiogram examinations.  Sonographer:     Daphine Deutscher RDCS Referring Phys:  [id number] Cheryln Manly Olufemi Mofield Diagnosing Phys: Marcina Millard MD IMPRESSIONS  1. Left ventricular ejection fraction, by estimation, is 50 to 55%. The left ventricle has low normal function. The left ventricle has no regional wall motion abnormalities. Indeterminate diastolic filling due to E-A fusion.  2. Right ventricular systolic function is normal. The right ventricular size is mildly enlarged.  3. Left atrial size was mild to moderately dilated.  4. The mitral valve is normal in structure. Moderate mitral valve regurgitation. No evidence of mitral stenosis.  5. Tricuspid valve regurgitation is moderate.  6. The aortic valve is normal in structure. Aortic valve regurgitation is not visualized. No aortic stenosis is present.  7. The inferior vena cava is normal in size with greater than 50% respiratory variability, suggesting right atrial pressure of 3 mmHg. FINDINGS  Left Ventricle: Left ventricular ejection fraction, by estimation, is 50 to 55%. The left ventricle has low normal function. The left ventricle has no regional wall motion abnormalities. The left ventricular internal cavity size was normal in size. There is no left ventricular hypertrophy. Indeterminate diastolic filling due to E-A fusion. Right Ventricle: The right ventricular size is mildly enlarged. No increase in right ventricular wall thickness. Right ventricular systolic function is normal. Left Atrium: Left atrial size was mild to moderately dilated. Right Atrium: Right atrial size was normal in size.  Pericardium: There is no evidence of pericardial effusion. Mitral Valve: The mitral valve is normal in structure. Moderate mitral valve regurgitation. No evidence of mitral valve stenosis. Tricuspid Valve: The tricuspid valve is normal in structure. Tricuspid valve regurgitation is moderate . No evidence of tricuspid stenosis. Aortic Valve: The aortic valve is normal in structure. Aortic valve regurgitation is not visualized. No aortic stenosis is present. Pulmonic Valve: The pulmonic valve was normal in structure. Pulmonic valve regurgitation is not visualized. No evidence of pulmonic stenosis. Aorta: The aortic root is normal in size and structure. Venous: The inferior vena cava is normal in size with greater than 50% respiratory variability, suggesting right atrial pressure of 3 mmHg. IAS/Shunts: No atrial level shunt detected by color flow Doppler.  LEFT VENTRICLE PLAX 2D LVIDd:         3.00 cm LVIDs:         2.20 cm LV PW:         0.70 cm LV IVS:        0.70 cm LVOT diam:     1.80 cm LV SV:         27 LV SV Index:   18 LVOT Area:     2.54 cm  RIGHT VENTRICLE             IVC RV Basal diam:  3.00 cm     IVC diam: 1.90 cm RV S prime:     13.82 cm/s TAPSE (M-mode): 1.9 cm LEFT ATRIUM             Index        RIGHT ATRIUM           Index LA diam:        4.20 cm 2.78 cm/m  RA Area:     12.40 cm LA Vol (A2C):   42.8 ml 28.32 ml/m  RA Volume:   30.70 ml  20.32 ml/m LA Vol (A4C):   53.5 ml 35.41 ml/m LA Biplane Vol: 48.0 ml 31.77 ml/m  AORTIC VALVE LVOT Vmax:   80.22 cm/s LVOT Vmean:  55.075 cm/s LVOT VTI:    0.108 m  AORTA Ao Root diam: 2.80 cm MV E velocity: 104.25 cm/s  TRICUSPID VALVE                             TR Peak grad:   19.0 mmHg                             TR Vmax:        218.00 cm/s                              SHUNTS                             Systemic VTI:  0.11 m                             Systemic Diam: 1.80 cm Marcina Millard MD Electronically signed by Marcina Millard MD Signature  Date/Time: 01/19/2023/1:25:14 PM    Final    DG Chest Port 1 View  Result Date: 01/17/2023 CLINICAL DATA:  Sepsis EXAM: PORTABLE CHEST 1 VIEW COMPARISON:  01/17/2023, 03/17/2020 FINDINGS: Slightly worse diffuse airspace opacity throughout the periphery of the right mid and lower lung compatible with pneumonia. Stable left lung aeration. Similar background hyperinflation. Negative for edema, effusion or pneumothorax. Normal heart size and vascularity. Trachea midline. Aorta atherosclerotic. No acute osseous finding. Degenerative changes of the shoulders. IMPRESSION: Slightly worse right mid and lower lung peripheral airspace opacity compatible with pneumonia. Electronically Signed   By: Judie Petit.  Shick M.D.   On: 01/17/2023 14:54   DG Chest 2 View  Result Date: 01/17/2023 CLINICAL DATA:  Cough, fatigue, nausea EXAM: CHEST - 2 VIEW COMPARISON:  03/17/2020 FINDINGS: The heart size and mediastinal contours are within normal limits. Heterogeneous airspace opacity throughout the peripheral right lower lobe. Pulmonary hyperinflation and emphysema. Nonacute wedge deformity of the midthoracic spine, approximately T7. IMPRESSION: 1. Heterogeneous airspace opacity throughout the peripheral right lower lobe, concerning for infection. Recommend follow-up PA and lateral radiographs in 6-8 weeks to ensure resolution and exclude underlying malignancy. 2.  Pulmonary hyperinflation and emphysema. Electronically Signed   By: Jearld Lesch M.D.   On: 01/17/2023 13:18    ECHO preserved LVEF 50-55% with mild to moderate LA dilation, moderate MR, moderate TR without additional valvular abnormality or regional wall motion abnormalities.  TELEMETRY reviewed by me (LT) 01/20/2023 : rate uncontrolled in the 130s-140s earlier AM. After addition of metoprolol tartrate 25mg  PO + consolidating cardizem to CD 180mg  - rate high 90s to low 100s    EKG reviewed by me: AF 99BPM   Data reviewed by me (LT) 01/20/2023: Hospitalist progress note, PT  note last 24h vitals tele labs imaging I/O   Principal Problem:   Atrial fibrillation with rapid ventricular response (HCC) Active Problems:   Sepsis (HCC)   Solitary kidney, congenital   CAP (community acquired pneumonia)  ASSESSMENT AND PLAN:  Charlene Anderson is an 49yoF with a PMH paroxysmal AF (dose reduced Eliquis), congenital solitary kidney who presented to [facility] ED 01/17/2023 from an urgent care and [place] with concern for tachycardia and community-acquired pneumonia.  Cardiology is consulted for assistance with her AF.  # Community-acquired pneumonia Presents with several days of coughing, chills, leukocytosis to 12 K, and febrile to 102.2 degrees F first night of admission, with CXR c/f RML and RLL opacity and a 2L oxygen requirement with none at baseline.  Clinical improvement overall with resolving leukocytosis and afebrile overnight. -On azithromycin and ceftriaxone per primary -Wean oxygen as tolerated  # Paroxysmal AF RVR # Hypokalemia Initially in RVR with rates in the 130s.  Suspect provoked in the setting of fever and CAP above, rate controlled after diltiazem infusion and replenishing electrolytes. -Continue to treat causes of increased adrenergic tone including fever, infection, pain, electrolyte disturbances, etc. -Continuous telemetry monitoring while hospitalized -S/p diltiazem infusion  - continue cardizem CD 180mg  daily  - add metoprolol tartrate 25mg  BID  -Continue Eliquis 2.5 mg twice daily for stroke prevention (age >80, weight <60 Kgs) -Monitor and replenish electrolytes for goal K >4, mag >2 -Echo complete preserved LVEF, moderate MR and TR  Anticipate discharge readiness from a cardiac perspective tomorrow AM, 6/8.   This patient's plan of care was discussed and created with Dr. Darrold Junker and he is in agreement.  Signed: Rebeca Allegra , PA-C 01/20/2023, 3:37 PM [facility]

## 2023-01-20 NOTE — Progress Notes (Signed)
Progress Note   Patient: Charlene Anderson EAV:[id number] DOB: 1941/05/05 DOA: 01/17/2023     3 DOS: the patient was seen and examined on 01/20/2023   Brief hospital course: Charlene Anderson is a 82 y.o. female with medical history significant of paroxysmal atrial fibrillation on Eliquis, solitary kidney, psoriasis, sent ED from [facility] with concern for A-fib with RVR and pneumonia. Pt reports slight cough for the past couple of days.  No fever, rhinorrhea, congestion, nausea, vomiting, chest pain or diarrhea. Feels somewhat short of breath when laying flat. She has been taking antibiotics (cefdinir) prescribed for a UTI on 01/14/23. Denies burning with urination but continues to have pain near her right kidney.    Patient ED initials vitals showed she was afebrile, tachycardic (HR 142) and tachypneic (RR 23) normotensive and satting well on room air.   ED workup included COVID, influenza and RSV negative.  Lactic acid 2.7.  CBC showing leukocytosis with left shift (WBC 12.5).  Blood cultures collected.  Urinalysis, respiratory panel pending.  Urgent care x-ray showing air space opacities throughout the right lower lobe concerning for pneumonia which was thought to be worsened on portal CXR. She was started on ceftriaxone and azithromycin and given a 1L bolus of NS. ED physician consulted hosptialist for evaluation for admission.    Assessment and Plan: Sepsis secondary to community-acquired pneumonia Acute respiratory failure As evidenced by fever with a Tmax of 102.2, marked leukocytosis, lactic acidosis and chest x-ray findings concerning for right middle and lower lobe pneumonia. With acute respiratory failure (room air pulse oximetry of 88%) Leukocytosis shows a downward trend.  Patient is afebrile Patient was ambulated in the hall and postovulatory pulse ox was 88%.  She may require home oxygen upon discharge Continue antibiotic therapy with Rocephin and Zithromax Continue  supplementation to maintain pulse oximetry greater than 92%     Atrial Fibrillation with Rapid Ventricular Rate  Most likely secondary to acute infection Has been weaned off Cardizem drip and is currently on p.o. Cardizem for rate control Heart rate continues to fluctuate especially with activity in the 120s and 130s Metoprolol 25 mg twice daily added.  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  - Avoid nephrotoxic agent and dehydration  - Trend serum creatine        Hypokalemia Supplement potassium Check magnesium levels       For possible discharge in a.m.               Subjective: Patient is seen and examined at the bedside  Physical Exam: Vitals:   01/19/23 1510 01/19/23 1620 01/19/23 2301 01/20/23 0857  BP: 93/66 (!) 105/56 108/73 117/72  Pulse: 96 93 97 (!) 103  Resp:  (!) 24 18 18   Temp: 98 F (36.7 C) 97.8 F (36.6 C) 98.9 F (37.2 C) (!) 97.5 F (36.4 C)  TempSrc:  Oral Oral   SpO2: 97% 96% 94% 98%  Weight:      Height:       GEN:     alert, non-toxic appearing elderly female and no distress     HENT:  mucus membranes moist, oropharyngeal without lesions or erythema,  nares patent, no nasal discharge EYES:   pupils equal and reactive, EOM intact NECK:  supple, good ROM, no lymphadenopathy  RESP: Bilateral air entry  CVS:  Irregularly irregular, tachycardic ABD:    soft, non-tender; bowel sounds present; no palpable masses, EXT:     normal ROM, atraumatic, no LE edema  NEURO:  normal without focal findings,  speech normal, alert and oriented at her baseline Skin:    warm and dry Psych: Normal affect, appropriate speech and behavior  Data Reviewed: Labs reviewed. There are no new results to review at this time.  Family Communication: Plan of care discussed with patient at the bedside.  Will discharge home in  a.m.  Disposition: Status is: Inpatient Remains inpatient appropriate because: Optimize rate control  Planned Discharge Destination: Home    Time spent: 35 minutes  Author: Lucile Shutters, MD 01/20/2023 1:47 PM  For on call review [email or web address].

## 2023-01-20 NOTE — Plan of Care (Signed)

## 2023-01-20 NOTE — Care Management Important Message (Signed)
Important Message  Patient Details  Name: Charlene Anderson MRN: [id number] Date of Birth: 04-Oct-1940   Medicare Important Message Given:  N/A - LOS <3 / Initial given by admissions     [name] [name] 01/20/2023, 2:23 PM

## 2023-01-21 ENCOUNTER — Inpatient Hospital Stay: Payer: Medicare Other

## 2023-01-21 DIAGNOSIS — I4891 Unspecified atrial fibrillation: Secondary | ICD-10-CM | POA: Diagnosis not present

## 2023-01-21 LAB — CBC
HCT: 35.5 % — ABNORMAL LOW (ref 36.0–46.0)
Hemoglobin: 11.7 g/dL — ABNORMAL LOW (ref 12.0–15.0)
MCH: 31.6 pg (ref 26.0–34.0)
MCHC: 33 g/dL (ref 30.0–36.0)
MCV: 95.9 fL (ref 80.0–100.0)
Platelets: 412 10*3/uL — ABNORMAL HIGH (ref 150–400)
RBC: 3.7 MIL/uL — ABNORMAL LOW (ref 3.87–5.11)
RDW: 13.2 % (ref 11.5–15.5)
WBC: 14.2 10*3/uL — ABNORMAL HIGH (ref 4.0–10.5)
nRBC: 0 % (ref 0.0–0.2)

## 2023-01-21 LAB — BASIC METABOLIC PANEL
Anion gap: 10 (ref 5–15)
BUN: 17 mg/dL (ref 8–23)
CO2: 22 mmol/L (ref 22–32)
Calcium: 8.5 mg/dL — ABNORMAL LOW (ref 8.9–10.3)
Chloride: 103 mmol/L (ref 98–111)
Creatinine, Ser: 0.92 mg/dL (ref 0.44–1.00)
GFR, Estimated: >60 mL/min (ref >60)
Glucose, Bld: 129 mg/dL — ABNORMAL HIGH (ref 70–99)
Potassium: 4.1 mmol/L (ref 3.5–5.1)
Sodium: 135 mmol/L (ref 135–145)

## 2023-01-21 MED ORDER — DOXYCYCLINE HYCLATE 100 MG PO TABS: 100.0000 mg | ORAL_TABLET | Freq: Two times a day (BID) | ORAL | 0 refills | Status: AC

## 2023-01-21 MED ORDER — APIXABAN 2.5 MG PO TABS: 2.5000 mg | ORAL_TABLET | Freq: Two times a day (BID) | ORAL | 0 refills | Status: AC

## 2023-01-21 MED ORDER — DILTIAZEM HCL ER COATED BEADS 180 MG PO CP24: 180.0000 mg | ORAL_CAPSULE | Freq: Every day | ORAL | 0 refills | Status: AC

## 2023-01-21 MED ORDER — FUROSEMIDE 20 MG PO TABS
20.0000 mg | ORAL_TABLET | Freq: Once | ORAL | Status: AC
  Administered 2023-01-21: 20 mg via ORAL
  Filled 2023-01-21: qty 1

## 2023-01-21 MED ORDER — GUAIFENESIN-DM 100-10 MG/5ML PO SYRP: 5.0000 mL | ORAL_SOLUTION | Freq: Two times a day (BID) | ORAL | 0 refills | Status: AC

## 2023-01-21 MED ORDER — METOPROLOL TARTRATE 25 MG PO TABS: 25.0000 mg | ORAL_TABLET | Freq: Two times a day (BID) | ORAL | 0 refills | Status: AC

## 2023-01-21 MED ORDER — ENSURE ENLIVE PO LIQD: 237.0000 mL | Freq: Three times a day (TID) | ORAL | 12 refills | Status: AC

## 2023-01-21 NOTE — Progress Notes (Signed)
[facility] CLINIC CARDIOLOGY CONSULT NOTE       Patient ID: Charlene Anderson MRN: [id number] DOB/AGE: Sep 16, 1940 82 y.o.  Admit date: 01/17/2023 Referring Physician Dr.  Joylene Igo Primary Physician Dr. Angus Palms Primary Cardiologist previous Dr. Lady Gary Reason for Consultation AF RVR  HPI: Charlene Anderson is an 50yoF with a PMH paroxysmal AF (dose reduced Eliquis), congenital solitary kidney who presented to [facility] ED 01/17/2023 from an urgent care and [place] with concern for tachycardia and community-acquired pneumonia.  Cardiology is consulted for assistance with her AF.  Interval history: -Patient states that she feels well this morning.   -Patient still with cough this a.m., remains on room air. -Rate better controlled this a.m. in the 100s on telemetry.  Patient continues to deny any palpitations, chest pain, shortness of breath.  Review of systems complete and found to be negative unless listed above     Past Medical History:  Diagnosis Date   Cancer (HCC)    skin ca   Cardiac murmur    Congenital absence of one kidney    Paroxysmal atrial fibrillation (HCC)    Varicose veins of lower extremity     Past Surgical History:  Procedure Laterality Date   ABDOMINAL HYSTERECTOMY      Medications Prior to Admission  Medication Sig Dispense Refill Last Dose   Cholecalciferol (VITAMIN D-1000 MAX ST) 25 MCG (1000 UT) tablet Take by mouth.   01/16/2023   ELIQUIS 5 MG TABS tablet SMARTSIG:1 Tablet(s) By Mouth Every 12 Hours   01/16/2023   Social History   Socioeconomic History   Marital status: Widowed    Spouse name: Not on file   Number of children: Not on file   Years of education: Not on file   Highest education level: Not on file  Occupational History   Not on file  Tobacco Use   Smoking status: Never   Smokeless tobacco: Never  Vaping Use   Vaping Use: Never used  Substance and Sexual Activity   Alcohol use: Never   Drug use: Never   Sexual activity: Not Currently   Other Topics Concern   Not on file  Social History Narrative   Not on file   Social Determinants of Health   Financial Resource Strain: Not on file  Food Insecurity: No Food Insecurity (01/18/2023)   Hunger Vital Sign    Worried About Running Out of Food in the Last Year: Never true    Ran Out of Food in the Last Year: Never true  Transportation Needs: No Transportation Needs (01/18/2023)   PRAPARE - Administrator, Civil Service (Medical): No    Lack of Transportation (Non-Medical): No  Physical Activity: Not on file  Stress: Not on file  Social Connections: Not on file  Intimate Partner Violence: Not At Risk (01/18/2023)   Humiliation, Afraid, Rape, and Kick questionnaire    Fear of Current or Ex-Partner: No    Emotionally Abused: No    Physically Abused: No    Sexually Abused: No    Family History  Problem Relation Age of Onset   Breast cancer Mother 40   Breast cancer Maternal Aunt 59   Breast cancer Cousin 50       mat cousin      Intake/Output Summary (Last 24 hours) at 01/21/2023 0916 Last data filed at 01/21/2023 0800 Gross per 24 hour  Intake 480 ml  Output 600 ml  Net -120 ml    Vitals:   01/19/23  2301 01/20/23 0857 01/20/23 1706 01/21/23 0006  BP: 108/73 117/72 (!) 102/57 (!) 113/98  Pulse: 97 (!) 103 81 94  Resp: 18 18 16 16   Temp: 98.9 F (37.2 C) (!) 97.5 F (36.4 C) 98.1 F (36.7 C) 98.3 F (36.8 C)  TempSrc: Oral     SpO2: 94% 98% 95% 98%  Weight:      Height:        PHYSICAL EXAM General: Pleasant ill-appearing elderly female, well nourished, in no acute distress.  Lying flat in bed HEENT:  Normocephalic and atraumatic. Neck:  No JVD.  Lungs: Normal respiratory effort on room air. Rhonchi on the right without appreciable crackles or wheezes. Dry cough. Heart: irregularly irregular. Normal S1 and S2 without gallops or murmurs.  Abdomen: Non-distended appearing.  Msk: Normal strength and tone for age. Extremities: Warm and well  perfused. No clubbing, cyanosis. Trace bilateral lower extremity edema at baseline per patient.  Neuro: Alert and oriented X 3. Psych:  Answers questions appropriately.   Labs: Basic Metabolic Panel: Recent Labs    01/20/23 0549 01/21/23 0505  NA 140 135  K 4.3 4.1  CL 112* 103  CO2 22 22  GLUCOSE 107* 129*  BUN 16 17  CREATININE 0.79 0.92  CALCIUM 8.4* 8.5*   Liver Function Tests: No results for input(s): "AST", "ALT", "ALKPHOS", "BILITOT", "PROT", "ALBUMIN" in the last 72 hours.  No results for input(s): "LIPASE", "AMYLASE" in the last 72 hours. CBC: Recent Labs    01/20/23 0549 01/21/23 0505  WBC 8.6 14.2*  HGB 11.7* 11.7*  HCT 35.9* 35.5*  MCV 95.5 95.9  PLT 360 412*   Cardiac Enzymes: No results for input(s): "CKTOTAL", "CKMB", "CKMBINDEX", "TROPONINIHS" in the last 72 hours. BNP: No results for input(s): "BNP" in the last 72 hours. D-Dimer: No results for input(s): "DDIMER" in the last 72 hours. Hemoglobin A1C: No results for input(s): "HGBA1C" in the last 72 hours. Fasting Lipid Panel: No results for input(s): "CHOL", "HDL", "LDLCALC", "TRIG", "CHOLHDL", "LDLDIRECT" in the last 72 hours. Thyroid Function Tests: No results for input(s): "TSH", "T4TOTAL", "T3FREE", "THYROIDAB" in the last 72 hours.  Invalid input(s): "FREET3" Anemia Panel: No results for input(s): "VITAMINB12", "FOLATE", "FERRITIN", "TIBC", "IRON", "RETICCTPCT" in the last 72 hours.   Radiology: ECHOCARDIOGRAM COMPLETE  Result Date: 01/19/2023    ECHOCARDIOGRAM REPORT   Patient Name:   Charlene Anderson Date of Exam: 01/18/2023 Medical Rec #:  [id number]            Height:       62.0 in Accession #:    [id number]           Weight:       115.0 lb Date of Birth:  03-18-1941            BSA:          1.511 m Patient Age:    82 years             BP:           113/68 mmHg Patient Gender: F                    HR:           115 bpm. Exam Location:  [facility] Procedure: 2D Echo, Cardiac Doppler and Color  Doppler Indications:     I48.91 Atrial Fibrillation  History:         Patient has no prior history of Echocardiogram examinations.  Sonographer:     Daphine Deutscher RDCS Referring Phys:  [id number] Cheryln Manly TANG Diagnosing Phys: Marcina Millard MD IMPRESSIONS  1. Left ventricular ejection fraction, by estimation, is 50 to 55%. The left ventricle has low normal function. The left ventricle has no regional wall motion abnormalities. Indeterminate diastolic filling due to E-A fusion.  2. Right ventricular systolic function is normal. The right ventricular size is mildly enlarged.  3. Left atrial size was mild to moderately dilated.  4. The mitral valve is normal in structure. Moderate mitral valve regurgitation. No evidence of mitral stenosis.  5. Tricuspid valve regurgitation is moderate.  6. The aortic valve is normal in structure. Aortic valve regurgitation is not visualized. No aortic stenosis is present.  7. The inferior vena cava is normal in size with greater than 50% respiratory variability, suggesting right atrial pressure of 3 mmHg. FINDINGS  Left Ventricle: Left ventricular ejection fraction, by estimation, is 50 to 55%. The left ventricle has low normal function. The left ventricle has no regional wall motion abnormalities. The left ventricular internal cavity size was normal in size. There is no left ventricular hypertrophy. Indeterminate diastolic filling due to E-A fusion. Right Ventricle: The right ventricular size is mildly enlarged. No increase in right ventricular wall thickness. Right ventricular systolic function is normal. Left Atrium: Left atrial size was mild to moderately dilated. Right Atrium: Right atrial size was normal in size. Pericardium: There is no evidence of pericardial effusion. Mitral Valve: The mitral valve is normal in structure. Moderate mitral valve regurgitation. No evidence of mitral valve stenosis. Tricuspid Valve: The tricuspid valve is normal in structure.  Tricuspid valve regurgitation is moderate . No evidence of tricuspid stenosis. Aortic Valve: The aortic valve is normal in structure. Aortic valve regurgitation is not visualized. No aortic stenosis is present. Pulmonic Valve: The pulmonic valve was normal in structure. Pulmonic valve regurgitation is not visualized. No evidence of pulmonic stenosis. Aorta: The aortic root is normal in size and structure. Venous: The inferior vena cava is normal in size with greater than 50% respiratory variability, suggesting right atrial pressure of 3 mmHg. IAS/Shunts: No atrial level shunt detected by color flow Doppler.  LEFT VENTRICLE PLAX 2D LVIDd:         3.00 cm LVIDs:         2.20 cm LV PW:         0.70 cm LV IVS:        0.70 cm LVOT diam:     1.80 cm LV SV:         27 LV SV Index:   18 LVOT Area:     2.54 cm  RIGHT VENTRICLE             IVC RV Basal diam:  3.00 cm     IVC diam: 1.90 cm RV S prime:     13.82 cm/s TAPSE (M-mode): 1.9 cm LEFT ATRIUM             Index        RIGHT ATRIUM           Index LA diam:        4.20 cm 2.78 cm/m   RA Area:     12.40 cm LA Vol (A2C):   42.8 ml 28.32 ml/m  RA Volume:   30.70 ml  20.32 ml/m LA Vol (A4C):   53.5 ml 35.41 ml/m LA Biplane Vol: 48.0 ml 31.77 ml/m  AORTIC VALVE LVOT Vmax:   80.22  cm/s LVOT Vmean:  55.075 cm/s LVOT VTI:    0.108 m  AORTA Ao Root diam: 2.80 cm MV E velocity: 104.25 cm/s  TRICUSPID VALVE                             TR Peak grad:   19.0 mmHg                             TR Vmax:        218.00 cm/s                              SHUNTS                             Systemic VTI:  0.11 m                             Systemic Diam: 1.80 cm Marcina Millard MD Electronically signed by Marcina Millard MD Signature Date/Time: 01/19/2023/1:25:14 PM    Final    DG Chest Port 1 View  Result Date: 01/17/2023 CLINICAL DATA:  Sepsis EXAM: PORTABLE CHEST 1 VIEW COMPARISON:  01/17/2023, 03/17/2020 FINDINGS: Slightly worse diffuse airspace opacity throughout the  periphery of the right mid and lower lung compatible with pneumonia. Stable left lung aeration. Similar background hyperinflation. Negative for edema, effusion or pneumothorax. Normal heart size and vascularity. Trachea midline. Aorta atherosclerotic. No acute osseous finding. Degenerative changes of the shoulders. IMPRESSION: Slightly worse right mid and lower lung peripheral airspace opacity compatible with pneumonia. Electronically Signed   By: Judie Petit.  Shick M.D.   On: 01/17/2023 14:54   DG Chest 2 View  Result Date: 01/17/2023 CLINICAL DATA:  Cough, fatigue, nausea EXAM: CHEST - 2 VIEW COMPARISON:  03/17/2020 FINDINGS: The heart size and mediastinal contours are within normal limits. Heterogeneous airspace opacity throughout the peripheral right lower lobe. Pulmonary hyperinflation and emphysema. Nonacute wedge deformity of the midthoracic spine, approximately T7. IMPRESSION: 1. Heterogeneous airspace opacity throughout the peripheral right lower lobe, concerning for infection. Recommend follow-up PA and lateral radiographs in 6-8 weeks to ensure resolution and exclude underlying malignancy. 2.  Pulmonary hyperinflation and emphysema. Electronically Signed   By: Jearld Lesch M.D.   On: 01/17/2023 13:18    ECHO preserved LVEF 50-55% with mild to moderate LA dilation, moderate MR, moderate TR without additional valvular abnormality or regional wall motion abnormalities.  TELEMETRY reviewed by me [facility]) 01/21/2023 : Atrial rate 90s to 100s, improved from yesterday  EKG reviewed by me: AF 99BPM   Data reviewed by me [facility]) 01/21/2023: Hospitalist progress note, PT note last 24h vitals tele labs imaging I/O   Principal Problem:   Atrial fibrillation with rapid ventricular response (HCC) Active Problems:   Sepsis (HCC)   Solitary kidney, congenital   CAP (community acquired pneumonia)    ASSESSMENT AND PLAN:  Charlene Anderson is an 37yoF with a PMH paroxysmal AF (dose reduced Eliquis), congenital solitary  kidney who presented to [facility] ED 01/17/2023 from an urgent care and [place] with concern for tachycardia and community-acquired pneumonia.  Cardiology is consulted for assistance with her AF.  # Community-acquired pneumonia Presents with several days of coughing, chills, leukocytosis to 12 K, and febrile to 102.2 degrees F first night  of admission, with CXR c/f RML and RLL opacity and a 2L oxygen requirement with none at baseline.  Clinical improvement overall with resolving leukocytosis and afebrile overnight. -On azithromycin and ceftriaxone per primary -Patient weaned to room air -Reported desaturation to the 80s when working with physical therapy yesterday  # Paroxysmal AF RVR # Hypokalemia Initially in RVR with rates in the 130s.  Suspect provoked in the setting of fever and CAP above, rate controlled after diltiazem infusion and replenishing electrolytes. -Continue to treat causes of increased adrenergic tone including fever, infection, pain, electrolyte disturbances, etc. -Continuous telemetry monitoring while hospitalized -S/p diltiazem infusion  -Continue cardizem CD 180mg  daily  -Continue metoprolol tartrate 25mg  BID  -Continue Eliquis 2.5 mg twice daily for stroke prevention (age >15, weight <60 Kgs) -Monitor and replenish electrolytes for goal K >4, mag >2 -Echo complete preserved LVEF, moderate MR and TR  Patient appears stable and ready for discharge today from cardiac perspective.   This patient's plan of care was discussed and created with Dr. Darrold Junker and he is in agreement.  Signed: Gale Journey , PA-C 01/21/2023, 9:16 AM [facility] Cardiology

## 2023-01-21 NOTE — Plan of Care (Signed)

## 2023-01-21 NOTE — Progress Notes (Signed)
Patient and family was given verbal and written discharge instruction, they acknowledge understanding and states they will comply.Patient and family understood to reduce the Eliquis dose for 5 mg to 2.5 mg.Patient taken to car by wheel chair, no distress noted when leaving the floor.

## 2023-01-21 NOTE — Discharge Summary (Signed)
Physician Discharge Summary   Patient: Charlene Anderson MRN: [id number] DOB: 1941-06-15  Admit date:     01/17/2023  Discharge date: 01/21/23  Discharge Physician: Yadier Bramhall   PCP: Rayetta Humphrey, MD   Recommendations at discharge:   Take medications as recommended  Discharge Diagnoses: Principal Problem:   Atrial fibrillation with rapid ventricular response (HCC) Active Problems:   Sepsis (HCC)   CAP (community acquired pneumonia)   Solitary kidney, congenital  Resolved Problems:   * No resolved hospital problems. *  Hospital Course: Charlene Anderson is a 82 y.o. female with medical history significant of paroxysmal atrial fibrillation on Eliquis, solitary kidney, psoriasis, sent ED from [facility] with concern for A-fib with RVR and pneumonia. Pt reports slight cough for the past couple of days.  No fever, rhinorrhea, congestion, nausea, vomiting, chest pain or diarrhea. Feels somewhat short of breath when laying flat. She has been taking antibiotics (cefdinir) prescribed for a UTI on 01/14/23. Denies burning with urination but continues to have pain near her right kidney.    Patient ED initials vitals showed she was afebrile, tachycardic (HR 142) and tachypneic (RR 23) normotensive and satting well on room air.   ED workup included COVID, influenza and RSV negative.  Lactic acid 2.7.  CBC showing leukocytosis with left shift (WBC 12.5).  Blood cultures collected.  Urinalysis, respiratory panel pending.  Urgent care x-ray showing air space opacities throughout the right lower lobe concerning for pneumonia which was thought to be worsened on portal CXR. She was started on ceftriaxone and azithromycin and given a 1L bolus of NS. ED physician consulted hosptialist for evaluation for admission.     Assessment and Plan:  Sepsis secondary to community-acquired pneumonia Acute respiratory failure As evidenced by fever with a Tmax of 102.2, marked leukocytosis, lactic  acidosis and chest x-ray findings concerning for right middle and lower lobe pneumonia. With acute respiratory failure (room air pulse oximetry of 88%).  Patient has been on IV antibiotics and will be discharged home on oral antibiotic therapy Patient was ambulated in the hall and post ambulatory pulse ox was 92%.   She does not require home oxygen upon discharge       Sepsis secondary to community-acquired pneumonia Acute respiratory failure As evidenced by fever with a Tmax of 102.2, marked leukocytosis, lactic acidosis and chest x-ray findings concerning for right middle and lower lobe pneumonia. With acute respiratory failure (room air pulse oximetry of 88%) Leukocytosis shows a downward trend.  Patient is afebrile Patient was ambulated in the hall and postovulatory pulse ox was 88%.  She may require home oxygen upon discharge Continue antibiotic therapy with Rocephin and Zithromax Continue supplementation to maintain pulse oximetry greater than 92%     Atrial Fibrillation with Rapid Ventricular Rate  Most likely secondary to acute infection Heart rate is controlled on metoprolol 25 mg twice daily and Cardizem 180 mg daily Continue Eliquis to 2.5 mg BID as primary prophylaxis for an acute stroke       Solitary Kidney  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  - Avoid nephrotoxic agent and dehydration  - Trend serum creatine        Hypokalemia Supplement potassium Check magnesium levels             Consultants: Cardiology Procedures performed: 2D echocardiogram Disposition: Home Diet recommendation:  Discharge Diet Orders (From admission, onward)     Start     Ordered   01/21/23 0000  Diet - low sodium heart healthy        01/21/23 1446           Cardiac diet DISCHARGE MEDICATION: Allergies as of 01/21/2023       Reactions   Sulfa Antibiotics Diarrhea        Medication List     TAKE these medications    apixaban 2.5 MG Tabs tablet Commonly known  as: ELIQUIS Take 1 tablet (2.5 mg total) by mouth 2 (two) times daily. What changed:  medication strength See the new instructions.   diltiazem 180 MG 24 hr capsule Commonly known as: CARDIZEM CD Take 1 capsule (180 mg total) by mouth daily. Start taking on: January 22, 2023   doxycycline 100 MG tablet Commonly known as: VIBRA-TABS Take 1 tablet (100 mg total) by mouth 2 (two) times daily for 3 days.   feeding supplement Liqd Take 237 mLs by mouth 3 (three) times daily between meals.   guaiFENesin-dextromethorphan 100-10 MG/5ML syrup Commonly known as: ROBITUSSIN DM Take 5 mLs by mouth 2 (two) times daily for 5 days.   metoprolol tartrate 25 MG tablet Commonly known as: LOPRESSOR Take 1 tablet (25 mg total) by mouth 2 (two) times daily.   Vitamin D-1000 Max St 25 MCG (1000 UT) tablet Generic drug: Cholecalciferol Take by mouth.               Durable Medical Equipment  (From admission, onward)           Start     Ordered   01/21/23 1447  DME Walker  Once       Question Answer Comment  Walker: With 5 Inch Wheels   Patient needs a walker to treat with the following condition Weakness      01/21/23 1446            Follow-up Information     Paraschos, Alexander, MD. Go in 1 week(s).   Specialty: Cardiology Contact information: [address] [facility] [place] [address] [phone]                Discharge Exam: Ceasar Mons Weights   01/17/23 1409  Weight: 52.2 kg   EN:     alert, non-toxic appearing elderly female and no distress     HENT:  mucus membranes moist, oropharyngeal without lesions or erythema,  nares patent, no nasal discharge EYES:   pupils equal and reactive, EOM intact NECK:  supple, good ROM, no lymphadenopathy  RESP: Bilateral air entry  CVS:    Irregularly irregular ABD:    soft, non-tender; bowel sounds present; no palpable masses, EXT:     normal ROM, atraumatic, no LE edema  NEURO:  normal  without focal findings,  speech normal, alert and oriented at her baseline Skin:    warm and dry Psych: Normal affect, appropriate speech and behavior  Data Reviewed:  Condition at discharge: stable  The results of significant diagnostics from this hospitalization (including imaging, microbiology, ancillary and laboratory) are listed below for reference.   Imaging Studies: DG Chest 2 View  Result Date: 01/21/2023 CLINICAL DATA:  Dyspnea EXAM: CHEST - 2 VIEW COMPARISON:  01/17/2023 chest radiograph. FINDINGS: Stable cardiomediastinal silhouette with normal heart size. No pneumothorax. Hyperinflated lungs. No pulmonary edema. Probable trace bilateral pleural effusions. Hazy patchy opacity throughout the mid to lower right lung, slightly worsened. Streaky left lung base opacity is worsened. IMPRESSION: 1. Hazy patchy opacity throughout the mid to lower right lung, slightly worsened,  favor aspiration or pneumonia. 2. Streaky left lung base opacity, worsened, favor atelectasis. 3. Hyperinflated lungs, suggesting COPD. 4. Probable trace bilateral pleural effusions. Electronically Signed   By: Delbert Phenix M.D.   On: 01/21/2023 13:16   ECHOCARDIOGRAM COMPLETE  Result Date: 01/19/2023    ECHOCARDIOGRAM REPORT   Patient Name:   JAZABELLA BRUSH Date of Exam: 01/18/2023 Medical Rec #:  [id number]            Height:       62.0 in Accession #:    [id number]           Weight:       115.0 lb Date of Birth:  Jan 13, 1941            BSA:          1.511 m Patient Age:    82 years             BP:           113/68 mmHg Patient Gender: F                    HR:           115 bpm. Exam Location:  [facility] Procedure: 2D Echo, Cardiac Doppler and Color Doppler Indications:     I48.91 Atrial Fibrillation  History:         Patient has no prior history of Echocardiogram examinations.  Sonographer:     Daphine Deutscher RDCS Referring Phys:  [id number] Cheryln Manly TANG Diagnosing Phys: Marcina Millard MD IMPRESSIONS  1. Left  ventricular ejection fraction, by estimation, is 50 to 55%. The left ventricle has low normal function. The left ventricle has no regional wall motion abnormalities. Indeterminate diastolic filling due to E-A fusion.  2. Right ventricular systolic function is normal. The right ventricular size is mildly enlarged.  3. Left atrial size was mild to moderately dilated.  4. The mitral valve is normal in structure. Moderate mitral valve regurgitation. No evidence of mitral stenosis.  5. Tricuspid valve regurgitation is moderate.  6. The aortic valve is normal in structure. Aortic valve regurgitation is not visualized. No aortic stenosis is present.  7. The inferior vena cava is normal in size with greater than 50% respiratory variability, suggesting right atrial pressure of 3 mmHg. FINDINGS  Left Ventricle: Left ventricular ejection fraction, by estimation, is 50 to 55%. The left ventricle has low normal function. The left ventricle has no regional wall motion abnormalities. The left ventricular internal cavity size was normal in size. There is no left ventricular hypertrophy. Indeterminate diastolic filling due to E-A fusion. Right Ventricle: The right ventricular size is mildly enlarged. No increase in right ventricular wall thickness. Right ventricular systolic function is normal. Left Atrium: Left atrial size was mild to moderately dilated. Right Atrium: Right atrial size was normal in size. Pericardium: There is no evidence of pericardial effusion. Mitral Valve: The mitral valve is normal in structure. Moderate mitral valve regurgitation. No evidence of mitral valve stenosis. Tricuspid Valve: The tricuspid valve is normal in structure. Tricuspid valve regurgitation is moderate . No evidence of tricuspid stenosis. Aortic Valve: The aortic valve is normal in structure. Aortic valve regurgitation is not visualized. No aortic stenosis is present. Pulmonic Valve: The pulmonic valve was normal in structure. Pulmonic valve  regurgitation is not visualized. No evidence of pulmonic stenosis. Aorta: The aortic root is normal in size and structure. Venous: The inferior vena cava is normal in size with  greater than 50% respiratory variability, suggesting right atrial pressure of 3 mmHg. IAS/Shunts: No atrial level shunt detected by color flow Doppler.  LEFT VENTRICLE PLAX 2D LVIDd:         3.00 cm LVIDs:         2.20 cm LV PW:         0.70 cm LV IVS:        0.70 cm LVOT diam:     1.80 cm LV SV:         27 LV SV Index:   18 LVOT Area:     2.54 cm  RIGHT VENTRICLE             IVC RV Basal diam:  3.00 cm     IVC diam: 1.90 cm RV S prime:     13.82 cm/s TAPSE (M-mode): 1.9 cm LEFT ATRIUM             Index        RIGHT ATRIUM           Index LA diam:        4.20 cm 2.78 cm/m   RA Area:     12.40 cm LA Vol (A2C):   42.8 ml 28.32 ml/m  RA Volume:   30.70 ml  20.32 ml/m LA Vol (A4C):   53.5 ml 35.41 ml/m LA Biplane Vol: 48.0 ml 31.77 ml/m  AORTIC VALVE LVOT Vmax:   80.22 cm/s LVOT Vmean:  55.075 cm/s LVOT VTI:    0.108 m  AORTA Ao Root diam: 2.80 cm MV E velocity: 104.25 cm/s  TRICUSPID VALVE                             TR Peak grad:   19.0 mmHg                             TR Vmax:        218.00 cm/s                              SHUNTS                             Systemic VTI:  0.11 m                             Systemic Diam: 1.80 cm Marcina Millard MD Electronically signed by Marcina Millard MD Signature Date/Time: 01/19/2023/1:25:14 PM    Final    DG Chest Port 1 View  Result Date: 01/17/2023 CLINICAL DATA:  Sepsis EXAM: PORTABLE CHEST 1 VIEW COMPARISON:  01/17/2023, 03/17/2020 FINDINGS: Slightly worse diffuse airspace opacity throughout the periphery of the right mid and lower lung compatible with pneumonia. Stable left lung aeration. Similar background hyperinflation. Negative for edema, effusion or pneumothorax. Normal heart size and vascularity. Trachea midline. Aorta atherosclerotic. No acute osseous finding. Degenerative  changes of the shoulders. IMPRESSION: Slightly worse right mid and lower lung peripheral airspace opacity compatible with pneumonia. Electronically Signed   By: Judie Petit.  Shick M.D.   On: 01/17/2023 14:54   DG Chest 2 View  Result Date: 01/17/2023 CLINICAL DATA:  Cough, fatigue, nausea EXAM: CHEST - 2 VIEW COMPARISON:  03/17/2020 FINDINGS: The heart size and mediastinal contours are within  normal limits. Heterogeneous airspace opacity throughout the peripheral right lower lobe. Pulmonary hyperinflation and emphysema. Nonacute wedge deformity of the midthoracic spine, approximately T7. IMPRESSION: 1. Heterogeneous airspace opacity throughout the peripheral right lower lobe, concerning for infection. Recommend follow-up PA and lateral radiographs in 6-8 weeks to ensure resolution and exclude underlying malignancy. 2.  Pulmonary hyperinflation and emphysema. Electronically Signed   By: Jearld Lesch M.D.   On: 01/17/2023 13:18    Microbiology: Results for orders placed or performed during the hospital encounter of 01/17/23  Resp panel by RT-PCR (RSV, Flu A&B, Covid) Anterior Nasal Swab     Status: None   Collection Time: 01/17/23  2:17 PM   Specimen: Anterior Nasal Swab  Result Value Ref Range Status   SARS Coronavirus 2 by RT PCR NEGATIVE NEGATIVE Final    Comment: (NOTE) SARS-CoV-2 target nucleic acids are NOT DETECTED.  The SARS-CoV-2 RNA is generally detectable in upper respiratory specimens during the acute phase of infection. The lowest concentration of SARS-CoV-2 viral copies this assay can detect is 138 copies/mL. A negative result does not preclude SARS-Cov-2 infection and should not be used as the sole basis for treatment or other patient management decisions. A negative result may occur with  improper specimen collection/handling, submission of specimen other than nasopharyngeal swab, presence of viral mutation(s) within the areas targeted by this assay, and inadequate number of  viral copies(<138 copies/mL). A negative result must be combined with clinical observations, patient history, and epidemiological information. The expected result is Negative.  Fact Sheet for Patients:  BloggerCourse.com  Fact Sheet for Healthcare Providers:  SeriousBroker.it  This test is no t yet approved or cleared by the [place] FDA and  has been authorized for detection and/or diagnosis of SARS-CoV-2 by FDA under an Emergency Use Authorization (EUA). This EUA will remain  in effect (meaning this test can be used) for the duration of the COVID-19 declaration under Section 564(b)(1) of the Act, 21 U.S.C.section 360bbb-3(b)(1), unless the authorization is terminated  or revoked sooner.       Influenza A by PCR NEGATIVE NEGATIVE Final   Influenza B by PCR NEGATIVE NEGATIVE Final    Comment: (NOTE) The Xpert Xpress SARS-CoV-2/FLU/RSV plus assay is intended as an aid in the diagnosis of influenza from Nasopharyngeal swab specimens and should not be used as a sole basis for treatment. Nasal washings and aspirates are unacceptable for Xpert Xpress SARS-CoV-2/FLU/RSV testing.  Fact Sheet for Patients: BloggerCourse.com  Fact Sheet for Healthcare Providers: SeriousBroker.it  This test is not yet approved or cleared by the [place] FDA and has been authorized for detection and/or diagnosis of SARS-CoV-2 by FDA under an Emergency Use Authorization (EUA). This EUA will remain in effect (meaning this test can be used) for the duration of the COVID-19 declaration under Section 564(b)(1) of the Act, 21 U.S.C. section 360bbb-3(b)(1), unless the authorization is terminated or revoked.     Resp Syncytial Virus by PCR NEGATIVE NEGATIVE Final    Comment: (NOTE) Fact Sheet for Patients: BloggerCourse.com  Fact Sheet for Healthcare  Providers: SeriousBroker.it  This test is not yet approved or cleared by the [place] FDA and has been authorized for detection and/or diagnosis of SARS-CoV-2 by FDA under an Emergency Use Authorization (EUA). This EUA will remain in effect (meaning this test can be used) for the duration of the COVID-19 declaration under Section 564(b)(1) of the Act, 21 U.S.C. section 360bbb-3(b)(1), unless the authorization is terminated or revoked.  Performed at [facility], [address] [place], [place] [address]   Blood Culture (routine x 2)     Status: None (Preliminary result)   Collection Time: 01/17/23  2:33 PM   Specimen: BLOOD  Result Value Ref Range Status   Specimen Description BLOOD BLOOD LEFT ARM  Final   Special Requests   Final    BOTTLES DRAWN AEROBIC AND ANAEROBIC Blood Culture results may not be optimal due to an inadequate volume of blood received in culture bottles   Culture   Final    NO GROWTH 4 DAYS Performed at [facility], [address] [place], [place] [address]    Report Status PENDING  Incomplete  Blood Culture (routine x 2)     Status: None (Preliminary result)   Collection Time: 01/17/23  2:33 PM   Specimen: BLOOD  Result Value Ref Range Status   Specimen Description BLOOD BLOOD RIGHT ARM  Final   Special Requests   Final    BOTTLES DRAWN AEROBIC AND ANAEROBIC Blood Culture adequate volume   Culture   Final    NO GROWTH 4 DAYS Performed at [facility], [address] [place], [place] [address]    Report Status PENDING  Incomplete  MRSA Next Gen by PCR, Nasal     Status: None   Collection Time: 01/17/23  3:50 PM   Specimen: Nasal Mucosa; Nasal Swab  Result Value Ref Range Status   MRSA by PCR Next Gen NOT DETECTED NOT DETECTED Final    Comment: (NOTE) The GeneXpert MRSA Assay (FDA approved for NASAL specimens only), is one component of a comprehensive MRSA colonization  surveillance program. It is not intended to diagnose MRSA infection nor to guide or monitor treatment for MRSA infections. Test performance is not FDA approved in patients less than 92 years old. Performed at [facility], [address] [place], [place] [address]   Urine Culture (for pregnant, neutropenic or urologic patients or patients with an indwelling urinary catheter)     Status: None   Collection Time: 01/17/23  9:30 PM   Specimen: Urine, Clean Catch  Result Value Ref Range Status   Specimen Description   Final    URINE, CLEAN CATCH Performed at [facility], [address], [place], [place] [address]    Special Requests   Final    NONE Performed at [facility], [address], [place], [place] [address]    Culture   Final    NO GROWTH Performed at [facility], [address] [place], [place] [address]    Report Status 01/19/2023 FINAL  Final    Labs: CBC: Recent Labs  Lab 01/17/23 1417 01/18/23 0614 01/19/23 0854 01/20/23 0549 01/21/23 0505  WBC 12.5* 9.7 9.1 8.6 14.2*  NEUTROABS 9.6*  --   --   --   --   HGB 13.0 12.0 12.4 11.7* 11.7*  HCT 39.9 36.0 38.2 35.9* 35.5*  MCV 96.4 94.7 97.0 95.5 95.9  PLT 329 304 360 360 412*   Basic Metabolic Panel: Recent Labs  Lab 01/17/23 1417 01/18/23 0614 01/19/23 0854 01/20/23 0549 01/21/23 0505  NA 135 137 137 140 135  K 3.4* 3.1* 3.2* 4.3 4.1  CL 101 107 106 112* 103  CO2 21* 20* 20* 22 22  GLUCOSE 146* 124* 120* 107* 129*  BUN 22 18 14 16 17   CREATININE 1.06* 0.87 0.92 0.79 0.92  CALCIUM 8.3* 7.8* 8.3* 8.4* 8.5*  MG  --  2.0  --   --   --    Liver Function Tests: Recent Labs  Lab 01/17/23 1417 01/18/23 0614  AST 37 34  ALT 18 20  ALKPHOS 53 51  BILITOT 1.0 0.8  PROT 6.9 5.7*  ALBUMIN 2.9* 2.4*   CBG: No results for input(s): "GLUCAP" in the last 168 hours.  Discharge time spent: greater than 30 minutes.  Signed: Lucile Shutters, MD [facility] 01/21/2023

## 2023-01-21 NOTE — Progress Notes (Signed)
Physical Therapy Treatment Patient Details Name: Charlene Anderson MRN: [id number] DOB: 09/24/1940 Today's Date: 01/21/2023   History of Present Illness Charlene Anderson is a 82 y.o. female with medical history significant of paroxysmal atrial fibrillation on Eliquis, solitary kidney, psoriasis, sent ED from [facility] with concern for A-fib with RVR and pneumonia.    PT Comments    Pt was sitting in recliner pre/post session. She endorses wanting to DC home but also endorsing L ankle/foot pain not present several days earlier. She was agreeable to session with encouragement. L ankle/foot pain limited pt's abilities overall. Today, pt required use of AD to offload wt on LLE. Overall pt is still eager to DC home when deemed stable. Will have supportive son for assistance at DC. Unable to perform stairs to simulate home entry this session but address next session.  Author will return in the morning to reassess mobility, safety, and pt's functional mobility prior to DC.    Recommendations for follow up therapy are one component of a multi-disciplinary discharge planning process, led by the attending physician.  Recommendations may be updated based on patient status, additional functional criteria and insurance authorization.     Assistance Recommended at Discharge Frequent or constant Supervision/Assistance  Patient can return home with the following A little help with walking and/or transfers;A little help with bathing/dressing/bathroom;Assistance with cooking/housework;Direct supervision/assist for medications management;Direct supervision/assist for financial management;Assist for transportation;Help with stairs or ramp for entrance   Equipment Recommendations  Rolling walker (2 wheels) If pt agreeable to receive.       Precautions / Restrictions Precautions Precautions: Fall Restrictions Weight Bearing Restrictions: No     Mobility  Bed Mobility  Transfers Overall transfer  level: Needs assistance Equipment used: Rolling walker (2 wheels) Transfers: Sit to/from Stand Sit to Stand: Min guard, Min assist     Ambulation/Gait Ambulation/Gait assistance: Min guard Assistive device: Rolling walker (2 wheels) Gait Pattern/deviations: Step-to pattern, Antalgic Gait velocity: decr     General Gait Details: Pt required use of RW + overall more assistance today due to L ankle/foot pain not present previous sessions   Stairs  General stair comments: Pt unable to tolerate wt acceptance on LLE to safely perform stairs. pt unwilling to attempt different techniques. Will readdress next session.     Balance Overall balance assessment: Needs assistance Sitting-balance support: Feet supported Sitting balance-Leahy Scale: Good     Standing balance support: No upper extremity supported, During functional activity Standing balance-Leahy Scale: Poor Standing balance comment: pt attempted gait to BR without AD however unsteady/unsafe. good balance with BUE on RW       Cognition Arousal/Alertness: Awake/alert Behavior During Therapy: WFL for tasks assessed/performed Overall Cognitive Status: Within Functional Limits for tasks assessed      General Comments: Pt is A and cooperative. Eager to DC home          PT Goals (current goals can now be found in the care plan section) Acute Rehab PT Goals Patient Stated Goal: return home    Frequency    Min 3X/week      PT Plan Current plan remains appropriate       AM-PAC PT "6 Clicks" Mobility   Outcome Measure  Help needed turning from your back to your side while in a flat bed without using bedrails?: None Help needed moving from lying on your back to sitting on the side of a flat bed without using bedrails?: A Little Help needed moving to and from  a bed to a chair (including a wheelchair)?: A Little Help needed standing up from a chair using your arms (e.g., wheelchair or bedside chair)?: A Little Help  needed to walk in hospital room?: A Little Help needed climbing 3-5 steps with a railing? : A Little 6 Click Score: 19    End of Session   Activity Tolerance: Patient tolerated treatment well;Patient limited by pain (severely limited by L ankle/foot pain) Patient left: in chair;with call bell/phone within reach;with family/visitor present Nurse Communication: Mobility status PT Visit Diagnosis: Unsteadiness on feet (R26.81);Other abnormalities of gait and mobility (R26.89);Muscle weakness (generalized) (M62.81);Difficulty in walking, not elsewhere classified (R26.2)     Time:  -     Charges:                        Jetta Lout PTA 01/21/23, 8:46 AM

## 2023-01-22 LAB — CULTURE, BLOOD (ROUTINE X 2): Special Requests: ADEQUATE

## 2023-10-26 ENCOUNTER — Other Ambulatory Visit: Payer: Self-pay | Admitting: Family Medicine

## 2023-10-26 DIAGNOSIS — Z780 Asymptomatic menopausal state: Secondary | ICD-10-CM

## 2023-10-26 DIAGNOSIS — I480 Paroxysmal atrial fibrillation: Secondary | ICD-10-CM

## 2023-11-02 ENCOUNTER — Other Ambulatory Visit: Payer: Self-pay | Admitting: Family Medicine

## 2023-11-02 DIAGNOSIS — Z1231 Encounter for screening mammogram for malignant neoplasm of breast: Secondary | ICD-10-CM

## 2023-11-07 ENCOUNTER — Ambulatory Visit
Admission: RE | Admit: 2023-11-07 | Discharge: 2023-11-07 | Disposition: A | Discharge status: Home / Self Care | Source: Ambulatory Visit | Attending: Family Medicine | Admitting: Family Medicine

## 2023-11-07 DIAGNOSIS — Z780 Asymptomatic menopausal state: Secondary | ICD-10-CM

## 2023-11-07 DIAGNOSIS — Z1231 Encounter for screening mammogram for malignant neoplasm of breast: Secondary | ICD-10-CM | POA: Insufficient documentation

## 2023-11-07 DIAGNOSIS — I480 Paroxysmal atrial fibrillation: Secondary | ICD-10-CM | POA: Diagnosis present

## 2024-07-14 ENCOUNTER — Ambulatory Visit
Admission: EM | Admit: 2024-07-14 | Discharge: 2024-07-14 | Disposition: A | Discharge status: Home / Self Care | Attending: Nurse Practitioner | Admitting: Nurse Practitioner

## 2024-07-14 ENCOUNTER — Encounter: Payer: Self-pay | Admitting: Emergency Medicine

## 2024-07-14 DIAGNOSIS — B029 Zoster without complications: Secondary | ICD-10-CM

## 2024-07-14 MED ORDER — VALACYCLOVIR HCL 1 G PO TABS: 1000.0000 mg | ORAL_TABLET | Freq: Two times a day (BID) | ORAL | 0 refills | Status: AC

## 2024-07-14 NOTE — ED Triage Notes (Signed)
 Patient c/o blistery rash on her left wrist and left chest that first started 2 weeks ago.  Patient has had shingles before in the same areas.

## 2024-07-14 NOTE — ED Provider Notes (Signed)
 [facility]    CSN: [id number] Arrival date & time: 07/14/24  0841      History   Chief Complaint Chief Complaint  Patient presents with   Rash    HPI Charlene Anderson is a 83 y.o. female.   Patient presents today for more than 2-week history of itchy, blistered rash to chest and new blisters that developed to left arm.  She reports history of shingles on the left wrist with blisters that went all the way up her arm to her elbow.  Reports the rash began itchy and red and the blisters just popped up.  No fevers, cough, congestion, sore throat.  She reports the rash to the chest is decreasing in size and has started to crust over.  However, the blisters to the left wrist are new.    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      Home Medications    Prior to Admission medications   Medication Sig Start Date End Date Taking? Authorizing Provider  apixaban  (ELIQUIS ) 2.5 MG TABS tablet Take 1 tablet (2.5 mg total) by mouth 2 (two) times daily. 01/21/23  Yes Agbata,  Tochukwu, MD  diltiazem  (CARDIZEM  CD) 180 MG 24 hr capsule Take 1 capsule (180 mg total) by mouth daily. 01/22/23 07/14/24 Yes Agbata, Tochukwu, MD  valACYclovir (VALTREX) 1000 MG tablet Take 1 tablet (1,000 mg total) by mouth 2 (two) times daily for 7 days. 07/14/24 07/21/24 Yes Chandra Harlene LABOR, NP  Cholecalciferol  (VITAMIN D -1000 MAX ST) 25 MCG (1000 UT) tablet Take by mouth.    [provider]  feeding supplement (ENSURE ENLIVE / ENSURE PLUS) LIQD Take 237 mLs by mouth 3 (three) times daily between meals. 01/21/23   Lanetta Lingo, MD  metoprolol  tartrate (LOPRESSOR ) 25 MG tablet Take 1 tablet (25 mg total) by mouth 2 (two) times daily. 01/21/23   Lanetta Lingo, MD    Family History Family History  Problem Relation Age of Onset   Breast cancer Mother 35   Breast cancer Maternal Aunt 46   Breast cancer Cousin 50       mat cousin    Social History Social History   Tobacco Use   Smoking status: Never   Smokeless tobacco: Never  Vaping Use   Vaping status: Never Used  Substance Use Topics   Alcohol use: Never   Drug use: Never     Allergies   Sulfa antibiotics   Review of Systems Review of Systems Per HPI  Physical Exam Triage Vital  Signs ED Triage Vitals  Encounter Vitals Group     BP 07/14/24 0917 118/74     Girls Systolic BP Percentile --      Girls Diastolic BP Percentile --      Boys Systolic BP Percentile --      Boys Diastolic BP Percentile --      Pulse Rate 07/14/24 0917 78     Resp 07/14/24 0917 14     Temp 07/14/24 0917 97.6 F (36.4 C)     Temp Source 07/14/24 0917 Oral     SpO2 07/14/24 0917 100 %     Weight 07/14/24 0915 115 lb 1.3 oz (52.2 kg)     Height 07/14/24 0915 5' 2 (1.575 m)     Head Circumference --      Peak Flow --      Pain Score 07/14/24 0915 5     Pain Loc --      Pain Education --      Exclude from Growth Chart --    No data found.  Updated Vital Signs BP 118/74 (BP Location: Left Arm)   Pulse 78   Temp 97.6  F (36.4 C) (Oral)   Resp 14   Ht 5' 2 (1.575 m)   Wt 115 lb 1.3 oz (52.2 kg)   SpO2 100%   BMI 21.05 kg/m   Visual Acuity Right Eye Distance:   Left Eye Distance:   Bilateral Distance:    Right Eye Near:   Left Eye Near:    Bilateral Near:     Physical Exam Vitals and nursing note reviewed.  Constitutional:      General: She is not in acute distress.    Appearance: Normal appearance. She is not toxic-appearing.  HENT:     Mouth/Throat:     Mouth: Mucous membranes are moist.     Pharynx: Oropharynx is clear.  Pulmonary:     Effort: Pulmonary effort is normal. No respiratory distress.  Skin:    General: Skin is warm and dry.     Capillary Refill: Capillary refill takes less than 2 seconds.     Findings: Erythema and rash present. Rash is vesicular.     Comments: Fluid-filled vesicles with some scabbed over vesicles noted to left chest wall.  No surrounding erythema, warmth, active drainage.  There is a cluster of vesicles with surrounding erythema to the left wrist additionally.  No active drainage.  Neurological:     Mental Status: She is alert and oriented to person, place, and time.  Psychiatric:        Behavior: Behavior is cooperative.      UC Treatments / Results  Labs (all labs ordered are listed, but only abnormal results are displayed) Labs Reviewed - No data to display  EKG   Radiology No results found.  Procedures Procedures (including critical care time)  Medications Ordered in UC Medications - No data to display  Initial Impression / Assessment and Plan / UC Course  I have reviewed the triage vital signs and the nursing notes.  Pertinent labs & imaging results that were available during my care of the patient were reviewed by me and considered in my medical decision making (see chart for details).   In triage, vital signs are stable and patient is well-appearing.  On exam, she has vesicular rash that is itchy and somewhat painful to the  left chest wall and left wrist.  She has had shingles and has felt similarly in  the past to the left arm.  Given vesicles continuing to develop, will treat with Valtrex.  Creatinine clearance 34 based on most recent kidney function with creatinine 1.0, renally dosed Valtrex at 1 g twice daily for 7 days.  Other supportive measures were discussed with the patient.  Return and ER precautions were discussed additionally.  The patient was given the opportunity to ask questions.  All questions answered to their satisfaction.  The patient is in agreement to this plan.   Final Clinical Impressions(s) / UC Diagnoses   Final diagnoses:  Herpes zoster without complication     Discharge Instructions      The Valtrex as prescribed to prevent new blisters from developing.  You can take Tylenol  as needed for rash pain.  In addition, you can use hydrocortisone cream if the rash is itchy.  Follow-up with primary care if symptoms do not improve with treatment.    ED Prescriptions     Medication Sig Dispense Auth. Provider   valACYclovir (VALTREX) 1000 MG tablet Take 1 tablet (1,000 mg total) by mouth 2 (two) times daily for 7 days. 14 tablet Chandra Harlene LABOR, NP      PDMP not reviewed this encounter.   Chandra Harlene LABOR, NP 07/14/24 [phone]

## 2024-07-14 NOTE — Discharge Instructions (Signed)
 The Valtrex  as prescribed to prevent new blisters from developing.  You can take Tylenol  as needed for rash pain.  In addition, you can use hydrocortisone cream if the rash is itchy.  Follow-up with primary care if symptoms do not improve with treatment.
